# Patient Record
Sex: Female | Born: 1970 | Race: White | Hispanic: No | Marital: Married | Smoking: Former smoker
Health system: Southern US, Community
[De-identification: ages and names within clinical notes are randomized; demographics above are authoritative.]

## PROBLEM LIST (undated history)

## (undated) DIAGNOSIS — E785 Hyperlipidemia, unspecified: Secondary | ICD-10-CM

## (undated) DIAGNOSIS — I10 Essential (primary) hypertension: Secondary | ICD-10-CM

## (undated) DIAGNOSIS — R7611 Nonspecific reaction to tuberculin skin test without active tuberculosis: Secondary | ICD-10-CM

## (undated) DIAGNOSIS — Z91018 Allergy to other foods: Secondary | ICD-10-CM

## (undated) DIAGNOSIS — T7840XA Allergy, unspecified, initial encounter: Secondary | ICD-10-CM

## (undated) DIAGNOSIS — E079 Disorder of thyroid, unspecified: Secondary | ICD-10-CM

## (undated) DIAGNOSIS — F410 Panic disorder [episodic paroxysmal anxiety] without agoraphobia: Secondary | ICD-10-CM

## (undated) HISTORY — DX: Disorder of thyroid, unspecified: E07.9

## (undated) HISTORY — DX: Allergy to other foods: Z91.018

## (undated) HISTORY — PX: TONSILLECTOMY: SHX5217

## (undated) HISTORY — DX: Essential (primary) hypertension: I10

## (undated) HISTORY — PX: OTHER SURGICAL HISTORY: SHX169

## (undated) HISTORY — PX: FOOT SURGERY: SHX648

## (undated) HISTORY — DX: Panic disorder (episodic paroxysmal anxiety): F41.0

## (undated) HISTORY — DX: Allergy, unspecified, initial encounter: T78.40XA

## (undated) HISTORY — DX: Hyperlipidemia, unspecified: E78.5

## (undated) HISTORY — DX: Nonspecific reaction to tuberculin skin test without active tuberculosis: R76.11

## (undated) HISTORY — PX: WRIST SURGERY: SHX841

## (undated) HISTORY — PX: BREAST REDUCTION SURGERY: SHX8

---

## 1998-12-15 ENCOUNTER — Other Ambulatory Visit: Admission: RE | Admit: 1998-12-15 | Discharge: 1998-12-15 | Payer: Self-pay | Admitting: *Deleted

## 1999-02-25 ENCOUNTER — Other Ambulatory Visit: Admission: RE | Admit: 1999-02-25 | Discharge: 1999-02-25 | Payer: Self-pay | Admitting: Obstetrics and Gynecology

## 2000-02-09 HISTORY — PX: REDUCTION MAMMAPLASTY: SUR839

## 2000-08-25 ENCOUNTER — Encounter (INDEPENDENT_AMBULATORY_CARE_PROVIDER_SITE_OTHER): Payer: Self-pay | Admitting: *Deleted

## 2000-08-25 ENCOUNTER — Other Ambulatory Visit: Admission: RE | Admit: 2000-08-25 | Discharge: 2000-08-25 | Payer: Self-pay | Admitting: Plastic Surgery

## 2001-05-11 ENCOUNTER — Ambulatory Visit (HOSPITAL_COMMUNITY): Admission: AD | Admit: 2001-05-11 | Discharge: 2001-05-11 | Payer: Self-pay | Admitting: Internal Medicine

## 2001-05-11 ENCOUNTER — Encounter: Payer: Self-pay | Admitting: Internal Medicine

## 2002-08-20 ENCOUNTER — Other Ambulatory Visit: Admission: RE | Admit: 2002-08-20 | Discharge: 2002-08-20 | Payer: Self-pay | Admitting: Internal Medicine

## 2003-02-09 HISTORY — PX: BREAST BIOPSY: SHX20

## 2004-02-09 HISTORY — PX: DILATION AND CURETTAGE OF UTERUS: SHX78

## 2004-12-07 ENCOUNTER — Inpatient Hospital Stay (HOSPITAL_COMMUNITY): Admission: AD | Admit: 2004-12-07 | Discharge: 2004-12-07 | Payer: Self-pay | Admitting: Gynecology

## 2004-12-11 ENCOUNTER — Encounter (INDEPENDENT_AMBULATORY_CARE_PROVIDER_SITE_OTHER): Payer: Self-pay | Admitting: Specialist

## 2004-12-11 ENCOUNTER — Ambulatory Visit (HOSPITAL_COMMUNITY): Admission: RE | Admit: 2004-12-11 | Discharge: 2004-12-11 | Payer: Self-pay | Admitting: Gynecology

## 2005-06-10 ENCOUNTER — Ambulatory Visit (HOSPITAL_COMMUNITY): Admission: RE | Admit: 2005-06-10 | Discharge: 2005-06-10 | Payer: Self-pay | Admitting: Internal Medicine

## 2006-08-22 ENCOUNTER — Ambulatory Visit (HOSPITAL_COMMUNITY): Admission: RE | Admit: 2006-08-22 | Discharge: 2006-08-22 | Payer: Self-pay | Admitting: Internal Medicine

## 2007-06-05 ENCOUNTER — Ambulatory Visit: Payer: Self-pay | Admitting: Obstetrics and Gynecology

## 2007-06-05 ENCOUNTER — Other Ambulatory Visit: Payer: Self-pay

## 2007-08-25 ENCOUNTER — Ambulatory Visit: Payer: Self-pay | Admitting: Obstetrics and Gynecology

## 2007-09-07 ENCOUNTER — Ambulatory Visit: Payer: Self-pay | Admitting: Obstetrics and Gynecology

## 2007-09-08 ENCOUNTER — Inpatient Hospital Stay: Payer: Self-pay | Admitting: Obstetrics and Gynecology

## 2009-06-27 ENCOUNTER — Encounter: Payer: Self-pay | Admitting: Cardiovascular Disease

## 2009-07-11 ENCOUNTER — Ambulatory Visit: Payer: Self-pay | Admitting: Cardiovascular Disease

## 2009-07-11 DIAGNOSIS — R9431 Abnormal electrocardiogram [ECG] [EKG]: Secondary | ICD-10-CM | POA: Insufficient documentation

## 2009-07-11 DIAGNOSIS — R002 Palpitations: Secondary | ICD-10-CM

## 2009-07-11 DIAGNOSIS — E785 Hyperlipidemia, unspecified: Secondary | ICD-10-CM | POA: Insufficient documentation

## 2009-07-14 ENCOUNTER — Encounter: Payer: Self-pay | Admitting: Cardiovascular Disease

## 2009-11-20 ENCOUNTER — Encounter: Payer: Self-pay | Admitting: Obstetrics and Gynecology

## 2010-03-10 NOTE — Assessment & Plan Note (Signed)
Summary: NP6/CHANGES IN EKG/FLUTTER IN CHEST   Visit Type:  Initial Consult Primary Provider:  Marisue Brooklyn  CC:  palpitations.  History of Present Illness: Ms. Diane Hahn is a very pleasant 40 year old woman with a history of anxiety/panic attacks recently seen by her physician, Marisue Brooklyn for symptoms of fluttering in her chest and abnormal EKG. She presents for evaluation.  Ms. Yearwood states that she has been undergoing carpal stress. She is a case Production designer, theatre/television/film and believes that her job is in jeopardy who state that she cuts. This has caused tremendous stress to her and her family. She has a busy work schedule working from early in the morning to late into the evening, takes care of her daughter. She walks 3 times per week with no significant symptoms or shortness of breath or chest pain.  She murmurs one episode when he state and advanced possible budget cuts that she was very stressed and had fluttering in her chest. It lasted for several seconds before it went away. She has not had any further episodes since that time.  EKG done in Dr. Carmela Hurt in its office shows sinus arrhythmia with heart rate of 65 beats per minute, poor R-wave progression through the precordial leads.  Preventive Screening-Counseling & Management  Alcohol-Tobacco     Smoking Status: quit      Drug Use:  no.    Current Medications (verified): 1)  Vitamin B-1 250 Mg  Tabs (Thiamine Hcl) .... Once Daily 2)  Vitamin D3 5000 Iu .... Once Daily 3)  Vitamin B Complex-C   Caps (B Complex-C) .... Once Daily 4)  Fish Oil   Oil (Fish Oil) .... Two Tablets Two Times A Day 5)  Lexapro 10 Mg Tabs (Escitalopram Oxalate) .... One Half Tablet Everyday 6)  Clarinex 5 Mg Tabs (Desloratadine) .... As Needed  Allergies (verified): 1)  ! Pcn 2)  ! Keflex  Past History:  Social History: Last updated: 07/11/2009 Full Time--Social Worker Married  Tobacco Use - Former. 2008 Alcohol Use - yes--1-2 glasses of wine with  dinner Drug Use - no  Risk Factors: Smoking Status: quit (07/11/2009)  Past Medical History: panic attacks hyperlipidemia  Past Surgical History: ovay and cyst removed tonsillectomy Breast reduction Foot surg. C-section  Family History: Valvular disease Family History of Coronary Artery Disease:  Family History of Sudden Cardiac Death:  Family History of Diabetes:  Family History of Hyperlipidemia:  Family History of Hypertension:  Family History of Cancer:  Family History of Thyroid Disease:   Social History: Full Time--Social Worker Married  Tobacco Use - Former. 2008 Alcohol Use - yes--1-2 glasses of wine with dinner Drug Use - no Smoking Status:  quit Drug Use:  no  Review of Systems  The patient denies fever, weight loss, weight gain, vision loss, decreased hearing, hoarseness, chest pain, syncope, dyspnea on exertion, peripheral edema, prolonged cough, abdominal pain, incontinence, muscle weakness, depression, and enlarged lymph nodes.         fluttering, resolved  Vital Signs:  Patient profile:   40 year old female Height:      65 inches Weight:      184 pounds BMI:     30.73 Pulse rate:   66 / minute BP sitting:   143 / 84  (left arm) Cuff size:   regular  Vitals Entered By: Bishop Dublin, CMA (July 11, 2009 3:47 PM)  Physical Exam  General:  Well developed, well nourished, in no acute distress. Head:  normocephalic and  atraumatic Neck:  Neck supple, no JVD. No masses, thyromegaly or abnormal cervical nodes. Chest Wall:  no deformities or breast masses noted Lungs:  Clear bilaterally to auscultation and percussion. Heart:  Non-displaced PMI, chest non-tender; regular rate and rhythm, S1, S2 without murmurs, rubs or gallops. Carotid upstroke normal, no bruit. . Pedals normal pulses. No edema, no varicosities. Abdomen:  Bowel sounds positive; abdomen soft and non-tender without masses, organomegaly, or hernias noted. No hepatosplenomegaly. Msk:   Back normal, normal gait. Muscle strength and tone normal. Pulses:  pulses normal in all 4 extremities Extremities:  No clubbing or cyanosis. Neurologic:  Alert and oriented x 3. Skin:  Intact without lesions or rashes. Psych:  Normal affect.    EKG  Procedure date:  07/11/2009  Findings:      normal sinus rhythm with sinus arrhythmia him a rate of 66 beats per minute, no other significant ST or T wave changes noted.  Impression & Recommendations:  Problem # 1:  ABNORMAL EKG (ICD-794.31) EKG shows sinus arrhythmia which is generally a benign rhythm. She is asymptomatic. He R wave progression is also not a particular concern. Sometimes this can be due to lead placement. The R-wave progression on our EKG today is essentially normal.  No further testing is needed. She is asymptomatic with exertion and no stress testing is needed.  Problem # 2:  PALPITATIONS (ICD-785.1) We did talk to her about ectopy and palpitations. The short period of fluttering that she had may have been due to ectopy or palpitations. If she continues to have frequent episodes, a heart monitor could be performed. The treatment would be low-dose beta blockers either on an as needed basis or on a daily basis.  as she has had no further episodes of fluttering, no further workup is needed.  Problem # 3:  HYPERLIPIDEMIA-MIXED (ICD-272.4) we spent some time talking about hyperlipidemia. She does have a strong family history of coronary artery disease. I suggested that she try aggressive diet and exercise with a recheck of her cholesterol in 3 months time. Ideally her cholesterol should be well below 200. If she cannot reach this goal, she may want to restart her low-dose generic cholesterol medication.  Patient Instructions: 1)  We will see you back as needed.

## 2010-03-10 NOTE — Letter (Signed)
Summary: HEALTH HX  HEALTH HX   Imported ByFrazier Butt Chriscoe 07/14/2009 10:24:08  _____________________________________________________________________  External Attachment:    Type:   Image     Comment:   External Document

## 2010-05-22 ENCOUNTER — Ambulatory Visit: Payer: Self-pay | Admitting: Obstetrics and Gynecology

## 2010-05-25 ENCOUNTER — Inpatient Hospital Stay: Payer: Self-pay | Admitting: Obstetrics and Gynecology

## 2010-05-25 HISTORY — PX: TUBAL LIGATION: SHX77

## 2010-06-16 ENCOUNTER — Ambulatory Visit: Payer: Self-pay | Admitting: Obstetrics and Gynecology

## 2010-06-26 NOTE — Op Note (Signed)
[place]                 ACCOUNT NO.:  [id number]   MEDICAL RECORD NO.:  [id number]          PATIENT TYPE:  AMB   LOCATION:  [facility]                           FACILITY:  [facility]   PHYSICIAN:  Ginger Carne, MD  DATE OF BIRTH:  31-Mar-1970   DATE OF PROCEDURE:  12/11/2004  DATE OF DISCHARGE:                                 OPERATIVE REPORT   PREOPERATIVE DIAGNOSES:  First trimester incomplete abortion.   POSTOPERATIVE DIAGNOSES:  First trimester incomplete abortion.   PROCEDURE:  Aspiration, dilatation and extraction.   SURGEON:  Ginger Carne, MD.   ASSISTANT:  None.   COMPLICATIONS:  None immediate.   ESTIMATED BLOOD LOSS:  Minimal.   ANESTHESIA:  MAC with Marcaine 0.5% with epinephrine 1:200,000 paracervical  block.   SPECIMENS:  Products of conception.   FINDINGS:  External genitalia, vulva and vagina normal, cervix smooth  without erosions or lesions. The uterus sounded to 8 cm, products of  conception noted. The cavity was smooth. Both adnexa palpable, found to be  normal.   DESCRIPTION OF PROCEDURE:  The patient prepped and draped in the usual  fashion and placed in the lithotomy position. Betadine solution used for  antiseptic after adequate MAC analgesia and a paracervical block obtained.  Tenaculum placed on the anterior lip of the cervix, dilatation to  accommodate a #7 suction curette was performed, specimen to pathology, no  bleeding noted at the end of the procedure. The patient returned to the post  anesthesia recovery room in excellent condition.      Ginger Carne, MD  Electronically Signed     SHB/MEDQ  D:  12/11/2004  T:  12/11/2004  Job:  [id number]

## 2010-07-09 ENCOUNTER — Emergency Department: Payer: Self-pay | Admitting: Emergency Medicine

## 2010-08-21 ENCOUNTER — Encounter: Payer: Self-pay | Admitting: Cardiovascular Disease

## 2010-11-16 ENCOUNTER — Other Ambulatory Visit (HOSPITAL_COMMUNITY): Payer: Self-pay | Admitting: Internal Medicine

## 2010-11-16 ENCOUNTER — Ambulatory Visit (HOSPITAL_COMMUNITY)
Admission: RE | Admit: 2010-11-16 | Discharge: 2010-11-16 | Disposition: A | Discharge status: Home / Self Care | Payer: BC Managed Care – PPO | Source: Ambulatory Visit | Attending: Internal Medicine | Admitting: Internal Medicine

## 2010-11-16 DIAGNOSIS — Z0000 Encounter for general adult medical examination without abnormal findings: Secondary | ICD-10-CM | POA: Insufficient documentation

## 2010-11-16 DIAGNOSIS — R059 Cough, unspecified: Secondary | ICD-10-CM | POA: Insufficient documentation

## 2010-11-16 DIAGNOSIS — R05 Cough: Secondary | ICD-10-CM | POA: Insufficient documentation

## 2010-11-16 DIAGNOSIS — R7611 Nonspecific reaction to tuberculin skin test without active tuberculosis: Secondary | ICD-10-CM | POA: Insufficient documentation

## 2011-02-04 ENCOUNTER — Ambulatory Visit (INDEPENDENT_AMBULATORY_CARE_PROVIDER_SITE_OTHER): Payer: BC Managed Care – PPO | Admitting: Internal Medicine

## 2011-02-04 ENCOUNTER — Encounter: Payer: Self-pay | Admitting: Internal Medicine

## 2011-02-04 VITALS — BP 110/80 | HR 81 | Temp 98.2°F | Wt 201.0 lb

## 2011-02-04 DIAGNOSIS — E785 Hyperlipidemia, unspecified: Secondary | ICD-10-CM | POA: Insufficient documentation

## 2011-02-04 DIAGNOSIS — K1370 Unspecified lesions of oral mucosa: Secondary | ICD-10-CM

## 2011-02-04 DIAGNOSIS — J40 Bronchitis, not specified as acute or chronic: Secondary | ICD-10-CM

## 2011-02-04 DIAGNOSIS — K121 Other forms of stomatitis: Secondary | ICD-10-CM | POA: Insufficient documentation

## 2011-02-04 DIAGNOSIS — D649 Anemia, unspecified: Secondary | ICD-10-CM | POA: Insufficient documentation

## 2011-02-04 MED ORDER — AZITHROMYCIN 250 MG PO TABS: ORAL_TABLET | ORAL | Status: AC

## 2011-02-04 MED ORDER — PREDNISONE (PAK) 10 MG PO TABS: ORAL_TABLET | ORAL | Status: AC

## 2011-02-04 NOTE — Patient Instructions (Signed)
Labs next Wednesday.  Start prednisone taper and antibiotics.  Call if symptoms aren't improving.  Follow up 2 weeks.

## 2011-02-04 NOTE — Progress Notes (Signed)
Subjective:    Patient ID: Diane Hahn, female    DOB: 11/25/1970, 40 y.o.   MRN: [id number]  HPI 40 year old female presents to establish care. She has 2 concerns today. First, she notes a three-week history of nasal congestion, cough, and shortness of breath. She notes that she cares for her 2 children, a 53-month-old infant and a 4-year-old. She notes that they're frequently ill with viral infections. She was initially sick in October and was treated with a Z-Pak with slight improvement in her symptoms. However, over the last 3 weeks she has had recurrent shortness of breath, and cough productive of purulent sputum. She can hear herself wheezing on occasion. She is a former smoker. She notes that she had a chronic cough as a child but was never diagnosed with asthma. She has not had any fever or chills. She has not taken any medication for this, except for occasional use of albuterol with minimal improvement. She denies any chest pain.  She is also concerned today about several month history of recurrent oral ulcers. She notes that he first developed after the birth of her infant 9 months ago. They're most prominent on the right side of her mouth. She sought treatment from her dentist and has been using a topical protective ointment with some improvement.  Outpatient Encounter Prescriptions as of 02/04/2011  Medication Sig Dispense Refill  . desloratadine (CLARINEX) 5 MG tablet Take 5 mg by mouth as needed.        . B Complex-C (B-COMPLEX WITH VITAMIN C) tablet Take 1 tablet by mouth daily.          Review of Systems  Constitutional: Negative for fever, chills, appetite change, fatigue and unexpected weight change.  HENT: Positive for congestion and mouth sores. Negative for ear pain, sore throat, trouble swallowing, neck pain, voice change and sinus pressure.   Eyes: Negative for visual disturbance.  Respiratory: Positive for cough, shortness of breath and wheezing. Negative for stridor.     Cardiovascular: Negative for chest pain, palpitations and leg swelling.  Gastrointestinal: Negative for nausea, vomiting, abdominal pain, diarrhea, constipation, blood in stool, abdominal distention and anal bleeding.  Genitourinary: Negative for dysuria and flank pain.  Musculoskeletal: Positive for arthralgias (right knee). Negative for myalgias and gait problem.  Skin: Negative for color change and rash.  Neurological: Negative for dizziness and headaches.  Hematological: Negative for adenopathy. Does not bruise/bleed easily.  Psychiatric/Behavioral: Negative for suicidal ideas, sleep disturbance and dysphoric mood. The patient is not nervous/anxious.    BP 110/80  Pulse 81  Temp(Src) 98.2 F (36.8 C) (Oral)  Wt 201 lb (91.173 kg)  SpO2 97%  LMP 01/28/2011     Objective:   Physical Exam  Constitutional: She is oriented to person, place, and time. She appears well-developed and well-nourished. No distress.  HENT:  Head: Normocephalic and atraumatic.  Right Ear: External ear normal.  Left Ear: External ear normal.  Nose: Nose normal.  Mouth/Throat: Oropharynx is clear and moist. Oral lesions (right buccal mucosa) present. No oropharyngeal exudate.  Eyes: Conjunctivae are normal. Pupils are equal, round, and reactive to light. Right eye exhibits no discharge. Left eye exhibits no discharge. No scleral icterus.  Neck: Normal range of motion. Neck supple. No tracheal deviation present. No thyromegaly present.  Cardiovascular: Normal rate, regular rhythm, normal heart sounds and intact distal pulses.  Exam reveals no gallop and no friction rub.   No murmur heard. Pulmonary/Chest: Effort normal. No accessory muscle usage. Not tachypneic. No  respiratory distress. She has decreased breath sounds (very poor air movement, prolonged expiratory phase). She has no wheezes. She has no rales. She exhibits no tenderness.  Musculoskeletal: Normal range of motion. She exhibits no edema and no  tenderness.  Lymphadenopathy:    She has no cervical adenopathy.  Neurological: She is alert and oriented to person, place, and time. No cranial nerve deficit. She exhibits normal muscle tone. Coordination normal.  Skin: Skin is warm and dry. No rash noted. She is not diaphoretic. No erythema. No pallor.  Psychiatric: She has a normal mood and affect. Her behavior is normal. Judgment and thought content normal.          Assessment & Plan:  1. Bronchitis - symptoms and exam are most consistent with bronchitis. Will start Z-Pak and prednisone taper. She will continue to use albuterol as needed for wheezing or cough. She will followup in 2 weeks. If no improvement, would favor getting a chest x-ray.  2. Oral ulcers -patient with recurrent oral ulcers. She does have a history of smoking. Given the persistence of these ulcers, question if biopsy might be helpful in determining the etiology. We will set her up with ENT for evaluation.

## 2011-02-10 ENCOUNTER — Other Ambulatory Visit (INDEPENDENT_AMBULATORY_CARE_PROVIDER_SITE_OTHER): Payer: BC Managed Care – PPO | Admitting: *Deleted

## 2011-02-10 DIAGNOSIS — E785 Hyperlipidemia, unspecified: Secondary | ICD-10-CM

## 2011-02-10 DIAGNOSIS — D649 Anemia, unspecified: Secondary | ICD-10-CM

## 2011-02-10 LAB — COMPREHENSIVE METABOLIC PANEL
BUN: 15 mg/dL (ref 6–23)
CO2: 28 mEq/L (ref 19–32)
GFR: 72.97 mL/min (ref >60.00)
Glucose, Bld: 93 mg/dL (ref 70–99)
Sodium: 140 mEq/L (ref 135–145)
Total Bilirubin: 0.8 mg/dL (ref 0.3–1.2)
Total Protein: 7.4 g/dL (ref 6.0–8.3)

## 2011-02-10 LAB — CBC WITH DIFFERENTIAL/PLATELET
Basophils Absolute: 0 10*3/uL (ref 0.0–0.1)
Eosinophils Absolute: 0.1 10*3/uL (ref 0.0–0.7)
HCT: 39.4 % (ref 36.0–46.0)
Hemoglobin: 13.5 g/dL (ref 12.0–15.0)
Lymphocytes Relative: 41.8 % (ref 12.0–46.0)
Lymphs Abs: 3.6 10*3/uL (ref 0.7–4.0)
MCHC: 34.2 g/dL (ref 30.0–36.0)
Neutro Abs: 3.9 10*3/uL (ref 1.4–7.7)
RDW: 14.2 % (ref 11.5–14.6)

## 2011-02-10 LAB — LIPID PANEL
Cholesterol: 229 mg/dL — ABNORMAL HIGH (ref 0–200)
VLDL: 33.2 mg/dL (ref 0.0–40.0)

## 2011-02-20 ENCOUNTER — Encounter: Payer: Self-pay | Admitting: Internal Medicine

## 2011-02-24 ENCOUNTER — Encounter: Payer: Self-pay | Admitting: Internal Medicine

## 2011-02-24 ENCOUNTER — Ambulatory Visit (INDEPENDENT_AMBULATORY_CARE_PROVIDER_SITE_OTHER): Payer: BC Managed Care – PPO | Admitting: Internal Medicine

## 2011-02-24 VITALS — BP 112/70 | HR 55 | Temp 97.6°F | Ht 66.0 in | Wt 199.0 lb

## 2011-02-24 DIAGNOSIS — J40 Bronchitis, not specified as acute or chronic: Secondary | ICD-10-CM

## 2011-02-24 DIAGNOSIS — K121 Other forms of stomatitis: Secondary | ICD-10-CM

## 2011-02-24 DIAGNOSIS — E785 Hyperlipidemia, unspecified: Secondary | ICD-10-CM

## 2011-02-24 DIAGNOSIS — K1370 Unspecified lesions of oral mucosa: Secondary | ICD-10-CM

## 2011-02-24 MED ORDER — ROSUVASTATIN CALCIUM 5 MG PO TABS: 5.0000 mg | ORAL_TABLET | Freq: Every day | ORAL | Status: DC

## 2011-02-24 NOTE — Progress Notes (Signed)
Subjective:    Patient ID: Diane Hahn, female    DOB: 1970-05-18, 41 y.o.   MRN: [id number]  HPI 41 year old female with a history of hyperlipidemia presents for followup. She was recently seen in clinic for an acute episode of bronchitis. She was treated with antibiotics and steroids. She reports complete resolution of her symptoms of cough and shortness of breath with the use of these medications. She denies any recurrence of symptoms.  In regards to her hyperlipidemia, recent cholesterol tests show LDL is elevated at 150. She notes a very strong family history of heart disease including in her father who died at age 19 after extensive heart disease. She also notes a cousin who died at age 35 from heart attack. She is interested in starting cholesterol medication. She notes that she has tried making significant changes to her diet without improvement in her cholesterol levels. She also notes that she tried Lipitor in the past and was not able to tolerate this medication. She is not sure what her specific reaction to the medication was.  In regards to her oral ulcers, she notes that her ulcers resolved completely after treatment with antibiotics and steroids for her bronchitis. They have not recurred. She has not yet seen an ENT physician for this.  Outpatient Encounter Prescriptions as of 02/24/2011  Medication Sig Dispense Refill  . B Complex-C (B-COMPLEX WITH VITAMIN C) tablet Take 1 tablet by mouth daily.        . Cholecalciferol (VITAMIN D-3) 5000 UNITS TABS Take 1 tablet by mouth daily.        Marland Kitchen desloratadine (CLARINEX) 5 MG tablet Take 5 mg by mouth as needed.        Marland Kitchen escitalopram (LEXAPRO) 10 MG tablet Take 20 mg by mouth daily.       . fish oil-omega-3 fatty acids 1000 MG capsule Take 4 g by mouth 2 (two) times daily.        Marland Kitchen PRESCRIPTION MEDICATION IRON supplement once daily       . Red Yeast Rice 600 MG CAPS Take by mouth daily.      . Thiamine HCl (VITAMIN B-1) 250 MG tablet Take  250 mg by mouth daily.        . rosuvastatin (CRESTOR) 5 MG tablet Take 1 tablet (5 mg total) by mouth daily.  30 tablet  3    Review of Systems  Constitutional: Negative for fever, chills, appetite change, fatigue and unexpected weight change.  HENT: Negative for ear pain, congestion, sore throat, trouble swallowing, neck pain, voice change and sinus pressure.   Eyes: Negative for visual disturbance.  Respiratory: Negative for cough, shortness of breath, wheezing and stridor.   Cardiovascular: Negative for chest pain, palpitations and leg swelling.  Gastrointestinal: Negative for nausea, vomiting, abdominal pain, diarrhea, constipation, blood in stool, abdominal distention and anal bleeding.  Genitourinary: Negative for dysuria and flank pain.  Musculoskeletal: Negative for myalgias, arthralgias and gait problem.  Skin: Negative for color change and rash.  Neurological: Negative for dizziness and headaches.  Hematological: Negative for adenopathy. Does not bruise/bleed easily.  Psychiatric/Behavioral: Negative for suicidal ideas, sleep disturbance and dysphoric mood. The patient is not nervous/anxious.        Objective:   Physical Exam  Constitutional: She is oriented to person, place, and time. She appears well-developed and well-nourished. No distress.  HENT:  Head: Normocephalic and atraumatic.  Right Ear: External ear normal.  Left Ear: External ear normal.  Nose: Nose normal.  Mouth/Throat: Oropharynx is clear and moist. No oropharyngeal exudate.  Eyes: Conjunctivae are normal. Pupils are equal, round, and reactive to light. Right eye exhibits no discharge. Left eye exhibits no discharge. No scleral icterus.  Neck: Normal range of motion. Neck supple. No tracheal deviation present. No thyromegaly present.  Cardiovascular: Normal rate, regular rhythm, normal heart sounds and intact distal pulses.  Exam reveals no gallop and no friction rub.   No murmur heard. Pulmonary/Chest:  Effort normal and breath sounds normal. No respiratory distress. She has no wheezes. She has no rales. She exhibits no tenderness.  Musculoskeletal: Normal range of motion. She exhibits no edema and no tenderness.  Lymphadenopathy:    She has no cervical adenopathy.  Neurological: She is alert and oriented to person, place, and time. No cranial nerve deficit. She exhibits normal muscle tone. Coordination normal.  Skin: Skin is warm and dry. No rash noted. She is not diaphoretic. No erythema. No pallor.  Psychiatric: She has a normal mood and affect. Her behavior is normal. Judgment and thought content normal.          Assessment & Plan:  1. Hyperlipidemia - LDL elevated at 150. Strong family h/o heart disease. Goal LDL<70. Will start Crestor 5mg  daily. (pt had adverse reaction to lipitor in the past).  Will recheck lipids and LFTs in 1 month.  2. Bronchitis - Symptoms have completely resolved with treatment with antibiotics and prednisone. Will continue to monitor.  3. Oral ulcers - Resolved. Will continue to monitor.

## 2011-03-29 ENCOUNTER — Other Ambulatory Visit (INDEPENDENT_AMBULATORY_CARE_PROVIDER_SITE_OTHER): Payer: BC Managed Care – PPO

## 2011-03-29 DIAGNOSIS — E785 Hyperlipidemia, unspecified: Secondary | ICD-10-CM

## 2011-03-29 LAB — LDL CHOLESTEROL, DIRECT: Direct LDL: 100.7 mg/dL

## 2011-03-29 LAB — COMPREHENSIVE METABOLIC PANEL
AST: 22 U/L (ref 0–37)
Alkaline Phosphatase: 73 U/L (ref 39–117)
BUN: 12 mg/dL (ref 6–23)
Creatinine, Ser: 0.9 mg/dL (ref 0.4–1.2)
Total Bilirubin: 0.3 mg/dL (ref 0.3–1.2)

## 2011-03-29 LAB — LIPID PANEL
HDL: 40.9 mg/dL (ref >39.00)
Total CHOL/HDL Ratio: 4
Triglycerides: 211 mg/dL — ABNORMAL HIGH (ref 0.0–149.0)

## 2011-10-20 ENCOUNTER — Other Ambulatory Visit: Payer: Self-pay | Admitting: Internal Medicine

## 2011-10-29 ENCOUNTER — Ambulatory Visit (INDEPENDENT_AMBULATORY_CARE_PROVIDER_SITE_OTHER): Payer: BC Managed Care – PPO | Admitting: Internal Medicine

## 2011-10-29 ENCOUNTER — Encounter: Payer: Self-pay | Admitting: Internal Medicine

## 2011-10-29 VITALS — BP 102/78 | HR 67 | Temp 98.3°F | Ht 66.0 in | Wt 194.0 lb

## 2011-10-29 DIAGNOSIS — E669 Obesity, unspecified: Secondary | ICD-10-CM | POA: Insufficient documentation

## 2011-10-29 DIAGNOSIS — Z1211 Encounter for screening for malignant neoplasm of colon: Secondary | ICD-10-CM

## 2011-10-29 DIAGNOSIS — F419 Anxiety disorder, unspecified: Secondary | ICD-10-CM | POA: Insufficient documentation

## 2011-10-29 DIAGNOSIS — E785 Hyperlipidemia, unspecified: Secondary | ICD-10-CM

## 2011-10-29 DIAGNOSIS — Z23 Encounter for immunization: Secondary | ICD-10-CM

## 2011-10-29 DIAGNOSIS — F411 Generalized anxiety disorder: Secondary | ICD-10-CM

## 2011-10-29 LAB — COMPREHENSIVE METABOLIC PANEL
ALT: 36 U/L — ABNORMAL HIGH (ref 0–35)
AST: 58 U/L — ABNORMAL HIGH (ref 0–37)
Creatinine, Ser: 0.8 mg/dL (ref 0.4–1.2)
GFR: 83.15 mL/min (ref >60.00)
Total Bilirubin: 0.6 mg/dL (ref 0.3–1.2)

## 2011-10-29 LAB — LDL CHOLESTEROL, DIRECT: Direct LDL: 134 mg/dL

## 2011-10-29 LAB — LIPID PANEL
HDL: 34.9 mg/dL — ABNORMAL LOW (ref >39.00)
Triglycerides: 182 mg/dL — ABNORMAL HIGH (ref 0.0–149.0)
VLDL: 36.4 mg/dL (ref 0.0–40.0)

## 2011-10-29 MED ORDER — ALPRAZOLAM 0.5 MG PO TABS: 0.5000 mg | ORAL_TABLET | Freq: Three times a day (TID) | ORAL | Status: DC | PRN

## 2011-10-29 MED ORDER — PHENTERMINE HCL 37.5 MG PO CAPS: 37.5000 mg | ORAL_CAPSULE | ORAL | Status: DC

## 2011-10-29 MED ORDER — ALPRAZOLAM 0.5 MG PO TBDP: 0.5000 mg | ORAL_TABLET | Freq: Two times a day (BID) | ORAL | Status: DC | PRN

## 2011-10-29 MED ORDER — ROSUVASTATIN CALCIUM 5 MG PO TABS: 5.0000 mg | ORAL_TABLET | Freq: Every day | ORAL | Status: DC

## 2011-10-29 NOTE — Assessment & Plan Note (Signed)
Will recheck lipids and LFTs with labs today. Continue Crestor. Encouraged efforts at healthy diet, low in saturated fat and high in fiber. And regular physical activity. Followup one month.

## 2011-10-29 NOTE — Assessment & Plan Note (Signed)
BMI 31. Patient is interested in trying supplement medications to help with weight loss. Cautioned her on using supplements given unknown side effects. We discussed prescription medication to help with appetite suppression. Will start phentermine daily. Discussed potential side effects of this medication. Patient will call if any questions or concerns. Goal weight loss 1 pound per week. Encouraged patient to keep a food diary and limit caloric intake. Encouraged her to set a goal of exercise 5 hours per week. Followup one month.

## 2011-10-29 NOTE — Progress Notes (Signed)
Subjective:    Patient ID: Diane Hahn, female    DOB: 07/25/1970, 41 y.o.   MRN: [id number]  HPI 41 year old female with history of anxiety, hyperlipidemia, and obesity presents for followup. In regards to hyperlipidemia, she reports full compliance of Crestor. She denies any side effects from this medication. Last LDL performed 6 months ago showed marked improvement in LDL with reduction to 100.  In regards to her anxiety, she reports worsening of symptoms immediately prior to her menstrual cycle. She has been using Xanax 2-3 days before the onset of her menses with some improvement in her symptoms. She has also been using Lexapro daily with some improvement. Anxiety is significant enough that it affects her ability to work and take care of her children.  In regards to her obesity, she is concerned about recent weight gain. She reports that she has heard that supplemental medications such as green tea extract might be helpful for losing weight. She reports that she does not have time to exercise because of work responsibilities in caring for young children. She does not follow a specific diet or keep a food diary.  Outpatient Encounter Prescriptions as of 10/29/2011  Medication Sig Dispense Refill  . desloratadine (CLARINEX) 5 MG tablet Take 5 mg by mouth as needed.        Marland Kitchen escitalopram (LEXAPRO) 20 MG tablet TAKE 1 TABLET BY MOUTH EVERY DAY  30 tablet  4  . ALPRAZolam (XANAX) 0.5 MG tablet Take 1 tablet (0.5 mg total) by mouth 3 (three) times daily as needed for sleep.  60 tablet  3  . Cholecalciferol (VITAMIN D-3) 5000 UNITS TABS Take 1 tablet by mouth daily.        . phentermine 37.5 MG capsule Take 1 capsule (37.5 mg total) by mouth every morning.  30 capsule  1  . rosuvastatin (CRESTOR) 5 MG tablet Take 1 tablet (5 mg total) by mouth daily.  30 tablet  3  . Thiamine HCl (VITAMIN B-1) 250 MG tablet Take 250 mg by mouth daily.         BP 102/78  Pulse 67  Temp 98.3 F (36.8 C) (Oral)   Ht 5\' 6"  (1.676 m)  Wt 194 lb (87.998 kg)  BMI 31.31 kg/m2  SpO2 97%  Review of Systems  Constitutional: Negative for fever, chills, appetite change, fatigue and unexpected weight change.  HENT: Negative for ear pain, congestion, sore throat, trouble swallowing, neck pain, voice change and sinus pressure.   Eyes: Negative for visual disturbance.  Respiratory: Negative for cough, shortness of breath, wheezing and stridor.   Cardiovascular: Negative for chest pain, palpitations and leg swelling.  Gastrointestinal: Negative for nausea, vomiting, abdominal pain, diarrhea, constipation, blood in stool, abdominal distention and anal bleeding.  Genitourinary: Negative for dysuria and flank pain.  Musculoskeletal: Negative for myalgias, arthralgias and gait problem.  Skin: Negative for color change and rash.  Neurological: Negative for dizziness and headaches.  Hematological: Negative for adenopathy. Does not bruise/bleed easily.  Psychiatric/Behavioral: Positive for dysphoric mood. Negative for suicidal ideas and disturbed wake/sleep cycle. The patient is nervous/anxious.        Objective:   Physical Exam  Constitutional: She is oriented to person, place, and time. She appears well-developed and well-nourished. No distress.  HENT:  Head: Normocephalic and atraumatic.  Right Ear: External ear normal.  Left Ear: External ear normal.  Nose: Nose normal.  Mouth/Throat: Oropharynx is clear and moist. No oropharyngeal exudate.  Eyes: Conjunctivae normal are normal. Pupils  are equal, round, and reactive to light. Right eye exhibits no discharge. Left eye exhibits no discharge. No scleral icterus.  Neck: Normal range of motion. Neck supple. No tracheal deviation present. No thyromegaly present.  Cardiovascular: Normal rate, regular rhythm, normal heart sounds and intact distal pulses.  Exam reveals no gallop and no friction rub.   No murmur heard. Pulmonary/Chest: Effort normal and breath sounds  normal. No respiratory distress. She has no wheezes. She has no rales. She exhibits no tenderness.  Musculoskeletal: Normal range of motion. She exhibits no edema and no tenderness.  Lymphadenopathy:    She has no cervical adenopathy.  Neurological: She is alert and oriented to person, place, and time. No cranial nerve deficit. She exhibits normal muscle tone. Coordination normal.  Skin: Skin is warm and dry. No rash noted. She is not diaphoretic. No erythema. No pallor.  Psychiatric: She has a normal mood and affect. Her behavior is normal. Judgment and thought content normal.          Assessment & Plan:

## 2011-10-29 NOTE — Assessment & Plan Note (Signed)
Patient with significant anxiety, particularly prior to her menstrual cycle. Improved with Lexapro and use of Xanax. We'll continue. Followup one month.

## 2011-11-25 ENCOUNTER — Encounter: Payer: Self-pay | Admitting: Internal Medicine

## 2011-11-25 ENCOUNTER — Telehealth: Payer: Self-pay | Admitting: Internal Medicine

## 2011-11-25 NOTE — Telephone Encounter (Signed)
Patient has panic attacks would like a note to be excused from jury duty for that reason.

## 2011-11-25 NOTE — Telephone Encounter (Signed)
Is this ok or does patient need an appt?

## 2011-11-25 NOTE — Telephone Encounter (Signed)
Routed you a letter for jury duty. I doubt they will excuse her for this.

## 2011-11-25 NOTE — Telephone Encounter (Signed)
Left message on voicemail at work advising patient that letter is ready for pick up will be left at front desk.

## 2011-12-10 ENCOUNTER — Ambulatory Visit (INDEPENDENT_AMBULATORY_CARE_PROVIDER_SITE_OTHER): Payer: BC Managed Care – PPO | Admitting: Internal Medicine

## 2011-12-10 ENCOUNTER — Encounter: Payer: Self-pay | Admitting: Internal Medicine

## 2011-12-10 VITALS — BP 122/84 | HR 82 | Temp 98.6°F | Ht 66.0 in | Wt 185.8 lb

## 2011-12-10 DIAGNOSIS — E669 Obesity, unspecified: Secondary | ICD-10-CM

## 2011-12-10 DIAGNOSIS — J309 Allergic rhinitis, unspecified: Secondary | ICD-10-CM

## 2011-12-10 DIAGNOSIS — Z0000 Encounter for general adult medical examination without abnormal findings: Secondary | ICD-10-CM | POA: Insufficient documentation

## 2011-12-10 DIAGNOSIS — E785 Hyperlipidemia, unspecified: Secondary | ICD-10-CM

## 2011-12-10 DIAGNOSIS — R7989 Other specified abnormal findings of blood chemistry: Secondary | ICD-10-CM

## 2011-12-10 MED ORDER — DESLORATADINE 5 MG PO TABS: 5.0000 mg | ORAL_TABLET | ORAL | Status: DC | PRN

## 2011-12-10 MED ORDER — ROSUVASTATIN CALCIUM 5 MG PO TABS: 5.0000 mg | ORAL_TABLET | Freq: Every day | ORAL | Status: DC

## 2011-12-10 NOTE — Assessment & Plan Note (Signed)
Symptoms well controlled with Clarinex. Will continue.

## 2011-12-10 NOTE — Assessment & Plan Note (Signed)
LFTs noted to be elevated on last check. We'll plan to repeat today.
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
## 2011-12-10 NOTE — Assessment & Plan Note (Signed)
Congratulated patient on 15 pound weight loss. Encouraged her to continue efforts at healthy diet and regular physical activity. Will continue phentermine to help with appetite suppression. Followup in 3 months or sooner as needed.

## 2011-12-10 NOTE — Assessment & Plan Note (Addendum)
Gen med exam normal today. PAP/pelvic and breast exam deferred as recently performed by OB/GYN. Reviewed recent September 2013 which showed slight elevation of LFTs. We'll plan to repeat LFTs today. Encouraged patient to continue efforts at weight loss. Congratulated her on recent 15 pound weight loss. Immunizations are up to date. Followup in 3 months or sooner as needed.

## 2011-12-16 ENCOUNTER — Other Ambulatory Visit (INDEPENDENT_AMBULATORY_CARE_PROVIDER_SITE_OTHER): Payer: BC Managed Care – PPO

## 2011-12-16 DIAGNOSIS — E785 Hyperlipidemia, unspecified: Secondary | ICD-10-CM

## 2011-12-16 LAB — COMPREHENSIVE METABOLIC PANEL
Alkaline Phosphatase: 70 U/L (ref 39–117)
Creatinine, Ser: 0.8 mg/dL (ref 0.4–1.2)
Glucose, Bld: 75 mg/dL (ref 70–99)
Sodium: 138 mEq/L (ref 135–145)
Total Bilirubin: 0.4 mg/dL (ref 0.3–1.2)
Total Protein: 7.3 g/dL (ref 6.0–8.3)

## 2011-12-16 LAB — LIPID PANEL
Cholesterol: 119 mg/dL (ref 0–200)
HDL: 25.7 mg/dL — ABNORMAL LOW (ref >39.00)
Total CHOL/HDL Ratio: 5
Triglycerides: 239 mg/dL — ABNORMAL HIGH (ref 0.0–149.0)
VLDL: 47.8 mg/dL — ABNORMAL HIGH (ref 0.0–40.0)

## 2011-12-22 ENCOUNTER — Ambulatory Visit: Payer: BC Managed Care – PPO | Admitting: Internal Medicine

## 2011-12-27 ENCOUNTER — Ambulatory Visit (INDEPENDENT_AMBULATORY_CARE_PROVIDER_SITE_OTHER): Payer: BC Managed Care – PPO | Admitting: Internal Medicine

## 2011-12-27 ENCOUNTER — Encounter: Payer: Self-pay | Admitting: Internal Medicine

## 2011-12-27 VITALS — BP 112/74 | HR 79 | Temp 97.9°F | Ht 66.0 in | Wt 182.8 lb

## 2011-12-27 DIAGNOSIS — R7989 Other specified abnormal findings of blood chemistry: Secondary | ICD-10-CM

## 2011-12-27 DIAGNOSIS — E785 Hyperlipidemia, unspecified: Secondary | ICD-10-CM

## 2011-12-27 MED ORDER — EPINEPHRINE 0.3 MG/0.3ML IJ DEVI: 0.3000 mg | Freq: Once | INTRAMUSCULAR | Status: DC

## 2011-12-27 NOTE — Progress Notes (Signed)
Subjective:    Patient ID: Diane Hahn, female    DOB: 02-24-1970, 41 y.o.   MRN: [id number]  HPI 41 year old female presents for followup of recent lab work. Labs are remarkable for mild elevation of liver function tests. Liver function tests were also noted to be elevated in September 2013. Patient denies any symptoms such as abdominal distention, change in bowel habits, easy bleeding or bruising. She reports previous vaccination against hepatitis B. She denies any family history of liver dysfunction. She had a liver ultrasound in 2012 which was normal. She reports moderate intake of alcohol, approximately 2 glasses of wine 2 times per week. She also occasionally uses Tylenol.  Outpatient Encounter Prescriptions as of 12/27/2011  Medication Sig Dispense Refill  . ALPRAZolam (XANAX) 0.5 MG tablet Take 1 tablet (0.5 mg total) by mouth 3 (three) times daily as needed for sleep.  60 tablet  3  . desloratadine (CLARINEX) 5 MG tablet Take 1 tablet (5 mg total) by mouth as needed.  30 tablet  11  . EPINEPHrine (EPI-PEN) 0.3 mg/0.3 mL DEVI Inject 0.3 mLs (0.3 mg total) into the muscle once.  1 Device  6  . escitalopram (LEXAPRO) 20 MG tablet TAKE 1 TABLET BY MOUTH EVERY DAY  30 tablet  4  . phentermine 37.5 MG capsule Take 1 capsule (37.5 mg total) by mouth every morning.  30 capsule  1  . rosuvastatin (CRESTOR) 5 MG tablet Take 1 tablet (5 mg total) by mouth daily.  30 tablet  11  . [DISCONTINUED] EPINEPHrine (EPI-PEN) 0.3 mg/0.3 mL DEVI Inject 0.3 mg into the muscle once.       BP 112/74  Pulse 79  Temp 97.9 F (36.6 C) (Oral)  Ht 5\' 6"  (1.676 m)  Wt 182 lb 12 oz (82.895 kg)  BMI 29.50 kg/m2  SpO2 98%  Review of Systems  Constitutional: Negative for fever, chills, appetite change, fatigue and unexpected weight change.  HENT: Negative for ear pain, congestion, sore throat, trouble swallowing, neck pain, voice change and sinus pressure.   Eyes: Negative for visual disturbance.  Respiratory:  Negative for cough, shortness of breath, wheezing and stridor.   Cardiovascular: Negative for chest pain, palpitations and leg swelling.  Gastrointestinal: Negative for nausea, vomiting, abdominal pain, diarrhea, constipation, blood in stool, abdominal distention and anal bleeding.  Genitourinary: Negative for dysuria and flank pain.  Musculoskeletal: Negative for myalgias, arthralgias and gait problem.  Skin: Negative for color change and rash.  Neurological: Negative for dizziness and headaches.  Hematological: Negative for adenopathy. Does not bruise/bleed easily.  Psychiatric/Behavioral: Negative for suicidal ideas, sleep disturbance and dysphoric mood. The patient is not nervous/anxious.        Objective:   Physical Exam  Constitutional: She is oriented to person, place, and time. She appears well-developed and well-nourished. No distress.  HENT:  Head: Normocephalic and atraumatic.  Right Ear: External ear normal.  Left Ear: External ear normal.  Nose: Nose normal.  Mouth/Throat: Oropharynx is clear and moist.  Eyes: Conjunctivae normal are normal. Pupils are equal, round, and reactive to light. Right eye exhibits no discharge. Left eye exhibits no discharge. No scleral icterus.  Neck: Normal range of motion. Neck supple. No tracheal deviation present. No thyromegaly present.  Cardiovascular: Normal rate, regular rhythm, normal heart sounds and intact distal pulses.  Exam reveals no gallop and no friction rub.   No murmur heard. Pulmonary/Chest: Effort normal and breath sounds normal. No respiratory distress. She has no wheezes. She has no  rales. She exhibits no tenderness.  Abdominal: Soft. Normal appearance and bowel sounds are normal. There is no hepatosplenomegaly. There is no tenderness.  Musculoskeletal: Normal range of motion. She exhibits no edema and no tenderness.  Lymphadenopathy:    She has no cervical adenopathy.  Neurological: She is alert and oriented to person,  place, and time. No cranial nerve deficit. She exhibits normal muscle tone. Coordination normal.  Skin: Skin is warm and dry. No rash noted. She is not diaphoretic. No erythema. No pallor.  Psychiatric: She has a normal mood and affect. Her behavior is normal. Judgment and thought content normal.          Assessment & Plan:

## 2011-12-27 NOTE — Assessment & Plan Note (Signed)
Congratulated patient on marked improvement in lipids. Encouraged her to continue efforts at healthy diet and regular physical activity. Will continue Crestor.

## 2011-12-27 NOTE — Assessment & Plan Note (Addendum)
Patient noted to have mild persistent elevation of AST and ALT. Reviewed potential causes for transaminitis today. Will send additional labs including testing for viral hepatitis, Wilson's disease, autoimmune hepatitis. Encouraged patient to limit use of alcohol. Encouraged her to limit use of Tylenol. Reviewed recent right upper quadrant ultrasound from 2012 which showed normal liver. If lab evaluation is normal, would favor repeat LFTs in 3 months.

## 2011-12-28 LAB — HEPATIC FUNCTION PANEL
ALT: 30 U/L (ref 0–35)
AST: 36 U/L (ref 0–37)
Albumin: 4.5 g/dL (ref 3.5–5.2)
Alkaline Phosphatase: 84 U/L (ref 39–117)
Bilirubin, Direct: 0.2 mg/dL (ref 0.0–0.3)
Total Protein: 7.5 g/dL (ref 6.0–8.3)

## 2011-12-28 LAB — PROTIME-INR: INR: 0.9 ratio (ref 0.8–1.0)

## 2011-12-28 LAB — HEPATITIS C ANTIBODY: HCV Ab: NEGATIVE

## 2011-12-28 LAB — HEPATITIS B SURFACE ANTIBODY,QUALITATIVE: Hep B S Ab: POSITIVE — AB

## 2011-12-30 LAB — ANTI-MICROSOMAL ANTIBODY LIVER / KIDNEY: LKM1 Ab: <=20 U (ref <=20.0)

## 2011-12-31 ENCOUNTER — Other Ambulatory Visit: Payer: Self-pay | Admitting: General Practice

## 2011-12-31 DIAGNOSIS — E669 Obesity, unspecified: Secondary | ICD-10-CM

## 2011-12-31 MED ORDER — PHENTERMINE HCL 37.5 MG PO CAPS: 37.5000 mg | ORAL_CAPSULE | ORAL | Status: DC

## 2011-12-31 NOTE — Telephone Encounter (Signed)
Phentermine last filled 10/29/11 qty 30 1 refill. Pt last seen on 11/18. Ok to refill?

## 2011-12-31 NOTE — Telephone Encounter (Signed)
Fine to refill 

## 2011-12-31 NOTE — Telephone Encounter (Signed)
Med called in

## 2012-01-23 ENCOUNTER — Emergency Department: Payer: Self-pay | Admitting: Emergency Medicine

## 2012-01-23 LAB — URINALYSIS, COMPLETE
Glucose,UR: NEGATIVE mg/dL (ref 0–75)
Leukocyte Esterase: NEGATIVE
Protein: NEGATIVE
Specific Gravity: 1.025 (ref 1.003–1.030)
Squamous Epithelial: 2

## 2012-01-23 LAB — COMPREHENSIVE METABOLIC PANEL
Albumin: 4.6 g/dL (ref 3.4–5.0)
Anion Gap: 12 (ref 7–16)
Calcium, Total: 9.2 mg/dL (ref 8.5–10.1)
Chloride: 105 mmol/L (ref 98–107)
Co2: 22 mmol/L (ref 21–32)
Creatinine: 0.85 mg/dL (ref 0.60–1.30)
EGFR (African American): >60
EGFR (Non-African Amer.): >60
Glucose: 171 mg/dL — ABNORMAL HIGH (ref 65–99)
Osmolality: 280 (ref 275–301)
Potassium: 3.8 mmol/L (ref 3.5–5.1)
SGOT(AST): 43 U/L — ABNORMAL HIGH (ref 15–37)
Sodium: 139 mmol/L (ref 136–145)

## 2012-01-23 LAB — CBC WITH DIFFERENTIAL/PLATELET
Basophil #: 0 10*3/uL (ref 0.0–0.1)
Basophil %: 0.2 %
Eosinophil %: 0.5 %
HCT: 42.6 % (ref 35.0–47.0)
HGB: 14.7 g/dL (ref 12.0–16.0)
Lymphocyte #: 0.4 10*3/uL — ABNORMAL LOW (ref 1.0–3.6)
Lymphocyte %: 4.6 %
MCV: 93 fL (ref 80–100)
Monocyte %: 5.8 %
Neutrophil #: 8.1 10*3/uL — ABNORMAL HIGH (ref 1.4–6.5)
Platelet: 223 10*3/uL (ref 150–440)
RBC: 4.59 10*6/uL (ref 3.80–5.20)
WBC: 9.2 10*3/uL (ref 3.6–11.0)

## 2012-01-23 LAB — LIPASE, BLOOD: Lipase: 105 U/L (ref 73–393)

## 2012-03-16 ENCOUNTER — Ambulatory Visit: Payer: BC Managed Care – PPO | Admitting: Internal Medicine

## 2012-05-04 ENCOUNTER — Ambulatory Visit (INDEPENDENT_AMBULATORY_CARE_PROVIDER_SITE_OTHER): Payer: BC Managed Care – PPO | Admitting: Internal Medicine

## 2012-05-04 ENCOUNTER — Telehealth: Payer: Self-pay | Admitting: *Deleted

## 2012-05-04 ENCOUNTER — Encounter: Payer: Self-pay | Admitting: Internal Medicine

## 2012-05-04 ENCOUNTER — Ambulatory Visit: Payer: Self-pay | Admitting: Internal Medicine

## 2012-05-04 VITALS — BP 120/78 | HR 85 | Temp 97.6°F | Wt 168.0 lb

## 2012-05-04 DIAGNOSIS — R109 Unspecified abdominal pain: Secondary | ICD-10-CM | POA: Insufficient documentation

## 2012-05-04 DIAGNOSIS — R52 Pain, unspecified: Secondary | ICD-10-CM

## 2012-05-04 DIAGNOSIS — R112 Nausea with vomiting, unspecified: Secondary | ICD-10-CM | POA: Insufficient documentation

## 2012-05-04 LAB — POCT URINALYSIS DIPSTICK
Leukocytes, UA: NEGATIVE
Nitrite, UA: NEGATIVE
Protein, UA: 30
Urobilinogen, UA: 1
pH, UA: 5.5

## 2012-05-04 LAB — COMPREHENSIVE METABOLIC PANEL
ALT: 19 U/L (ref 0–35)
CO2: 24 mEq/L (ref 19–32)
Calcium: 8.9 mg/dL (ref 8.4–10.5)
Chloride: 100 mEq/L (ref 96–112)
GFR: 69 mL/min (ref >60.00)
Sodium: 133 mEq/L — ABNORMAL LOW (ref 135–145)
Total Protein: 7.7 g/dL (ref 6.0–8.3)

## 2012-05-04 LAB — CBC WITH DIFFERENTIAL/PLATELET
Basophils Absolute: 0 10*3/uL (ref 0.0–0.1)
Eosinophils Absolute: 0 10*3/uL (ref 0.0–0.7)
HCT: 43 % (ref 36.0–46.0)
Lymphs Abs: 0.6 10*3/uL — ABNORMAL LOW (ref 0.7–4.0)
MCHC: 34.2 g/dL (ref 30.0–36.0)
Monocytes Relative: 6.6 % (ref 3.0–12.0)
Platelets: 206 10*3/uL (ref 150.0–400.0)
RDW: 13.4 % (ref 11.5–14.6)

## 2012-05-04 LAB — LIPASE: Lipase: 17 U/L (ref 11.0–59.0)

## 2012-05-04 MED ORDER — PANTOPRAZOLE SODIUM 40 MG PO TBEC: 40.0000 mg | DELAYED_RELEASE_TABLET | Freq: Every day | ORAL | Status: DC

## 2012-05-04 MED ORDER — ONDANSETRON HCL 8 MG PO TABS: 8.0000 mg | ORAL_TABLET | Freq: Three times a day (TID) | ORAL | Status: DC | PRN

## 2012-05-04 NOTE — Telephone Encounter (Signed)
I think that her current symptoms of nausea and diarrhea may be related to a viral infection. I am still waiting on one lab test, the H. Pylori.I would recommend that she use the Zofran for nausea and try to increase intake of clear liquids as much as tolerated.  If symptoms persist over the next 24-48hr or worsen, she may ultimately need admission for IVF.   If symptoms are not improving over next few days, we will also need to set up evaluation with GI.

## 2012-05-04 NOTE — Progress Notes (Signed)
Subjective:    Patient ID: Diane Hahn, female    DOB: 05-03-1970, 42 y.o.   MRN: [id number]  HPI 42 year old female presents for acute visit complaining of 2-3 month history of intermittent episodes of abdominal pain associated with nausea, vomiting. These episodes sometimes wake her from sleep. They do not seem to be associated with particular foods. They resolve in about 12 hours each time. She has been taking Zofran with improvement in nausea. Most recent episode started yesterday. This episode was unique in its persistence and concomitant symptom of diarrhea. Patient notes that emesis typically consist of food particles. She denies hematemesis. Diarrhea with this episode has been watery. She denies any blood in her stool. She denies black stools. Abdominal pain is described as generally diffuse but sometimes more pronounced in the epigastric area. She feels as if there is a "weight "over her abdomen. She notes that in the distant past, she was evaluated for cholecystitis with right upper quadrant ultrasound which was normal. This was approximately 2 years ago. She denies any recent fever, chills, weight change. She denies any urinary symptoms.  Outpatient Encounter Prescriptions as of 05/04/2012  Medication Sig Dispense Refill  . EPINEPHrine (EPI-PEN) 0.3 mg/0.3 mL DEVI Inject 0.3 mLs (0.3 mg total) into the muscle once.  1 Device  6  . escitalopram (LEXAPRO) 20 MG tablet TAKE 1 TABLET BY MOUTH EVERY DAY  30 tablet  4  . etodolac (LODINE) 400 MG tablet Take 400 mg by mouth 2 (two) times daily.      Marland Kitchen ALPRAZolam (XANAX) 0.5 MG tablet Take 1 tablet (0.5 mg total) by mouth 3 (three) times daily as needed for sleep.  60 tablet  3  . desloratadine (CLARINEX) 5 MG tablet Take 1 tablet (5 mg total) by mouth as needed.  30 tablet  11  . ondansetron (ZOFRAN) 8 MG tablet Take 1 tablet (8 mg total) by mouth every 8 (eight) hours as needed for nausea.  60 tablet  1  . pantoprazole (PROTONIX) 40 MG tablet Take  1 tablet (40 mg total) by mouth daily.  30 tablet  3  . phentermine 37.5 MG capsule Take 1 capsule (37.5 mg total) by mouth every morning.  30 capsule  1  . rosuvastatin (CRESTOR) 5 MG tablet Take 1 tablet (5 mg total) by mouth daily.  30 tablet  11   No facility-administered encounter medications on file as of 05/04/2012.   BP 120/78  Pulse 85  Temp(Src) 97.6 F (36.4 C) (Oral)  Wt 168 lb (76.204 kg)  BMI 27.13 kg/m2  SpO2 97%  Review of Systems  Constitutional: Negative for fever, chills, appetite change, fatigue and unexpected weight change.  HENT: Negative for ear pain, congestion, sore throat, trouble swallowing, neck pain, voice change and sinus pressure.   Eyes: Negative for visual disturbance.  Respiratory: Negative for cough, shortness of breath, wheezing and stridor.   Cardiovascular: Negative for chest pain, palpitations and leg swelling.  Gastrointestinal: Positive for nausea, vomiting, abdominal pain and diarrhea. Negative for constipation, blood in stool, abdominal distention and anal bleeding.  Genitourinary: Negative for dysuria and flank pain.  Musculoskeletal: Negative for myalgias, arthralgias and gait problem.  Skin: Negative for color change and rash.  Neurological: Negative for dizziness and headaches.  Hematological: Negative for adenopathy. Does not bruise/bleed easily.  Psychiatric/Behavioral: Negative for suicidal ideas, sleep disturbance and dysphoric mood. The patient is not nervous/anxious.        Objective:   Physical Exam  Constitutional: She  is oriented to person, place, and time. She appears well-developed and well-nourished. No distress.  HENT:  Head: Normocephalic and atraumatic.  Right Ear: External ear normal.  Left Ear: External ear normal.  Nose: Nose normal.  Mouth/Throat: Oropharynx is clear and moist. No oropharyngeal exudate.  Eyes: Conjunctivae are normal. Pupils are equal, round, and reactive to light. Right eye exhibits no discharge.  Left eye exhibits no discharge. No scleral icterus.  Neck: Normal range of motion. Neck supple. No tracheal deviation present. No thyromegaly present.  Cardiovascular: Normal rate, regular rhythm, normal heart sounds and intact distal pulses.  Exam reveals no gallop and no friction rub.   No murmur heard. Pulmonary/Chest: Effort normal and breath sounds normal. No respiratory distress. She has no wheezes. She has no rales. She exhibits no tenderness.  Abdominal: Normal appearance and bowel sounds are normal. There is no hepatosplenomegaly. There is tenderness in the epigastric area. There is no CVA tenderness.  Musculoskeletal: Normal range of motion. She exhibits no edema and no tenderness.  Lymphadenopathy:    She has no cervical adenopathy.  Neurological: She is alert and oriented to person, place, and time. No cranial nerve deficit. She exhibits normal muscle tone. Coordination normal.  Skin: Skin is warm and dry. No rash noted. She is not diaphoretic. No erythema. No pallor.  Psychiatric: She has a normal mood and affect. Her behavior is normal. Judgment and thought content normal.          Assessment & Plan:

## 2012-05-04 NOTE — Telephone Encounter (Signed)
Can you please let her know that the CT was okay on prelim report?

## 2012-05-04 NOTE — Telephone Encounter (Signed)
Marzella Schlein from [facility] called to give a call report. No acute findings of the abdomen and pelvic

## 2012-05-04 NOTE — Assessment & Plan Note (Signed)
2-3 month history of intermittent abdominal pain and nausea/vomiting. Symptoms are concerning for chronic pancreatitis. Will check CMP, lipase, CBC with labs today. Given marked tenderness in the epigastric area on exam, will get CT of the abdomen and pelvis for further evaluation. We'll use Zofran as needed for nausea. Will start pantoprazole 40 mg daily. Will check for H. pylori with labs. Followup in one week or sooner as needed. We discussed that if symptoms are persistent imaging is unremarkable, she will need to have upper endoscopy for further evaluation.

## 2012-05-04 NOTE — Telephone Encounter (Signed)
Patient informed and verbalized understanding, however she said she is still not feeling any better. Would like to know what she should do? Still having the nausea and diarrhea and it is getting worse.

## 2012-05-05 NOTE — Telephone Encounter (Signed)
Patient informed of plan and verbalized understanding.

## 2012-05-08 ENCOUNTER — Other Ambulatory Visit: Payer: Self-pay | Admitting: *Deleted

## 2012-05-08 ENCOUNTER — Telehealth: Payer: Self-pay | Admitting: Internal Medicine

## 2012-05-08 DIAGNOSIS — R197 Diarrhea, unspecified: Secondary | ICD-10-CM

## 2012-05-08 NOTE — Telephone Encounter (Signed)
TC to patient regarding update in condition/report.  On callback, unable to reach patient at number given; message left on identified voicemail to contact office.  krs/can

## 2012-05-08 NOTE — Telephone Encounter (Signed)
This pt had dropped of a stool pack, but there was no orders put in? What labs did you what?

## 2012-05-08 NOTE — Telephone Encounter (Signed)
Stool culture, o and p, and testing for CDiff.

## 2012-05-10 LAB — OVA AND PARASITE SCREEN

## 2012-05-10 LAB — CLOSTRIDIUM DIFFICILE EIA: CDIFTX: NEGATIVE

## 2012-05-11 ENCOUNTER — Telehealth: Payer: Self-pay | Admitting: *Deleted

## 2012-05-11 MED ORDER — CLARITHROMYCIN 500 MG PO TABS: 500.0000 mg | ORAL_TABLET | Freq: Two times a day (BID) | ORAL | Status: DC

## 2012-05-11 MED ORDER — METRONIDAZOLE 500 MG PO TABS: 500.0000 mg | ORAL_TABLET | Freq: Two times a day (BID) | ORAL | Status: DC

## 2012-05-11 NOTE — Telephone Encounter (Signed)
Patient is allergic to Penicillin, what are the alternatives? Send to [facility] [address]  Notes Recorded by Wynona Dove, MD on 05/10/2012 at 4:16 PM Labs are positive for H. Pylori. We need to let her know and call in a Prevpac. Please confirm no penicillin allergy. Notes Recorded by Wynona Dove, MD on 05/04/2012 at 4:05 PM Labs are normal. I am still waiting on test of H. Pylori.

## 2012-05-11 NOTE — Telephone Encounter (Signed)
Patient informed and she already has some Pantoprazole at home so did not need any sent to the pharmacy. Prescriptions sent to pharmacy on file and will call back once she has completed course of treatment for orders to be sent to [facility].

## 2012-05-11 NOTE — Telephone Encounter (Signed)
She will need to take: Pantoprazole 40mg  po bid x 2 weeks Clarithromycin 500mg  po bid x 2 weeks Metronidazole 500mg  po bid x 2 weeks  Please call this in. After completing, she will need to have a breath test at [facility] to demonstrate cure.

## 2012-05-11 NOTE — Telephone Encounter (Deleted)
Message copied by Theola Sequin on Thu May 11, 2012  8:55 AM ------      Message from: Ronna Polio A      Created: Thu May 04, 2012 11:51 AM       Can you send for culture? ------

## 2012-05-17 ENCOUNTER — Telehealth: Payer: Self-pay | Admitting: Internal Medicine

## 2012-05-17 NOTE — Telephone Encounter (Signed)
Patient Information:  Caller Name: [name]  Phone: [phone]  Patient: Diane, Hahn  Gender: Female  DOB: 10/20/1970  Age: 42 Years  PCP: Ronna Polio (Adults only)  Pregnant: No  Office Follow Up:  Does the office need to follow up with this patient?: No  Instructions For The Office: N/A   Symptoms  Reason For Call & Symptoms: Violent vomiting, diarrhea x5 days then seen in office, CT Scan then H.Pylori came back positive on 4/3 so started Biaxin, Flagyl, Protonix.  Not feeling well, only eating small amounts.  Cough started 3 days ago Sun 4/6.  Has been waking at night due to dry nose, throat.  Very restless since Sun, no energy.  Feels like trying to cough something out of lower throat but can't get anything out.  Reviewed Health History In EMR: Yes  Reviewed Medications In EMR: Yes  Reviewed Allergies In EMR: Yes  Reviewed Surgeries / Procedures: Yes  Date of Onset of Symptoms: 05/14/2012 OB / GYN:  LMP: 04/28/2012  Guideline(s) Used:  Cough  Disposition Per Guideline:   See Today or Tomorrow in Office  Reason For Disposition Reached:   Patient wants to be seen  Advice Given:  Coughing Spasms:  Drink warm fluids. Inhale warm mist (Reason: both relax the airway and loosen up the phlegm).  Suck on cough drops or hard candy to coat the irritated throat.  Prevent Dehydration:  Drink adequate liquids.  This will help soothe an irritated or dry throat and loosen up the phlegm.  Call Back If:  You become worse  Patient Will Follow Care Advice:  YES  Appointment Scheduled:  05/18/2012 10:15:00 Appointment Scheduled Provider:  Orville Govern

## 2012-05-18 ENCOUNTER — Ambulatory Visit (INDEPENDENT_AMBULATORY_CARE_PROVIDER_SITE_OTHER): Payer: BC Managed Care – PPO | Admitting: Adult Health

## 2012-05-18 ENCOUNTER — Encounter: Payer: Self-pay | Admitting: Adult Health

## 2012-05-18 ENCOUNTER — Encounter: Payer: Self-pay | Admitting: Internal Medicine

## 2012-05-18 VITALS — BP 120/80 | HR 97 | Temp 97.6°F | Resp 14 | Ht 66.0 in | Wt 163.2 lb

## 2012-05-18 DIAGNOSIS — R05 Cough: Secondary | ICD-10-CM

## 2012-05-18 MED ORDER — DEXTROMETHORPHAN POLISTIREX 30 MG/5ML PO LQCR: 60.0000 mg | ORAL | Status: DC | PRN

## 2012-05-18 NOTE — Progress Notes (Signed)
  Subjective:    Patient ID: Diane Hahn, female    DOB: April 12, 1970, 42 y.o.   MRN: [id number]  HPI  Patient presents to clinic with cough since Monday. She also felt some sinus pressure. She is currently being treated with antibiotic for positive H. Pylori. She denies fever, chills. Her cough is worse at night when she's lying down. She has been taking Tylenol for the sinus pressure.  Current Outpatient Prescriptions on File Prior to Visit  Medication Sig Dispense Refill  . ALPRAZolam (XANAX) 0.5 MG tablet Take 1 tablet (0.5 mg total) by mouth 3 (three) times daily as needed for sleep.  60 tablet  3  . clarithromycin (BIAXIN) 500 MG tablet Take 1 tablet (500 mg total) by mouth 2 (two) times daily.  28 tablet  0  . desloratadine (CLARINEX) 5 MG tablet Take 1 tablet (5 mg total) by mouth as needed.  30 tablet  11  . EPINEPHrine (EPI-PEN) 0.3 mg/0.3 mL DEVI Inject 0.3 mLs (0.3 mg total) into the muscle once.  1 Device  6  . escitalopram (LEXAPRO) 20 MG tablet TAKE 1 TABLET BY MOUTH EVERY DAY  30 tablet  4  . metroNIDAZOLE (FLAGYL) 500 MG tablet Take 1 tablet (500 mg total) by mouth 2 (two) times daily.  28 tablet  0  . pantoprazole (PROTONIX) 40 MG tablet Take 1 tablet (40 mg total) by mouth daily.  30 tablet  3  . etodolac (LODINE) 400 MG tablet Take 400 mg by mouth 2 (two) times daily.      . ondansetron (ZOFRAN) 8 MG tablet Take 1 tablet (8 mg total) by mouth every 8 (eight) hours as needed for nausea.  60 tablet  1  . phentermine 37.5 MG capsule Take 1 capsule (37.5 mg total) by mouth every morning.  30 capsule  1  . rosuvastatin (CRESTOR) 5 MG tablet Take 1 tablet (5 mg total) by mouth daily.  30 tablet  11   No current facility-administered medications on file prior to visit.     Review of Systems  Constitutional: Negative for fever and chills.  HENT: Positive for sinus pressure. Negative for congestion, rhinorrhea and postnasal drip.   Respiratory: Positive for cough. Negative for  chest tightness, shortness of breath and wheezing.        Objective:   Physical Exam        Assessment & Plan:

## 2012-05-18 NOTE — Assessment & Plan Note (Signed)
Cough worse at night when she's lying down. I suspect that this may be related to GERD. She is currently being treated for H. pylori and will complete therapy a week from today. I am not able to give her any prescription cough medication as this will interact with one of the medications that she is currently on. I have recommended that she tried cough drops and over-the-counter cough medicines such as Delsym. I have also instructed her to elevate her head when sleeping. If symptoms persist after she has completed treatment for H. pylori, she is to call the office.

## 2012-05-18 NOTE — Patient Instructions (Addendum)
  Continue to use cough drops and OTC cough medicine.  Try Delsym which seems to last a little longer.   Do not take any products with alcohol in them as this will cause a reaction with the current treatment for H. Pylori   Stay hydrated.

## 2012-05-24 ENCOUNTER — Telehealth: Payer: Self-pay

## 2012-05-24 NOTE — Telephone Encounter (Signed)
Returned patient phone call, left message for her to call office back to clarify the message she left on the office voicemail.

## 2012-06-13 ENCOUNTER — Other Ambulatory Visit: Payer: Self-pay | Admitting: Internal Medicine

## 2012-06-13 NOTE — Telephone Encounter (Signed)
Rx sent to pharmacy by escript  

## 2012-07-26 ENCOUNTER — Telehealth: Payer: Self-pay | Admitting: *Deleted

## 2012-07-26 DIAGNOSIS — F419 Anxiety disorder, unspecified: Secondary | ICD-10-CM

## 2012-07-26 MED ORDER — ALPRAZOLAM 0.5 MG PO TABS: 0.5000 mg | ORAL_TABLET | Freq: Three times a day (TID) | ORAL | Status: DC | PRN

## 2012-07-26 NOTE — Telephone Encounter (Signed)
Patient left a voicemail stating her mother had a heart attack over the weekend and was rushed to the hospital. Now she is having panic attacks, would like to know if something could be called in for her or does she need to make an appointment to discuss. She was given Xanax in September of last year.

## 2012-07-26 NOTE — Telephone Encounter (Signed)
Given the current situation, it would be fine to refill her Xanax. She should be cautious using this medication as it may cause drowsiness. If symptoms of anxiety are persisting, we should see her back in clinic.

## 2012-07-26 NOTE — Telephone Encounter (Signed)
Patient informed and prescription sent to pharmacy 

## 2012-08-28 ENCOUNTER — Other Ambulatory Visit: Payer: Self-pay | Admitting: Internal Medicine

## 2012-08-29 NOTE — Telephone Encounter (Signed)
Eprescribed.

## 2012-12-13 ENCOUNTER — Encounter: Payer: BC Managed Care – PPO | Admitting: Internal Medicine

## 2013-01-02 ENCOUNTER — Other Ambulatory Visit: Payer: Self-pay | Admitting: Internal Medicine

## 2013-01-08 ENCOUNTER — Encounter: Payer: Self-pay | Admitting: Internal Medicine

## 2013-01-08 ENCOUNTER — Ambulatory Visit (INDEPENDENT_AMBULATORY_CARE_PROVIDER_SITE_OTHER): Payer: BC Managed Care – PPO | Admitting: Internal Medicine

## 2013-01-08 VITALS — BP 110/80 | HR 65 | Temp 97.6°F | Ht 65.0 in | Wt 177.0 lb

## 2013-01-08 DIAGNOSIS — E669 Obesity, unspecified: Secondary | ICD-10-CM

## 2013-01-08 DIAGNOSIS — E785 Hyperlipidemia, unspecified: Secondary | ICD-10-CM

## 2013-01-08 DIAGNOSIS — F411 Generalized anxiety disorder: Secondary | ICD-10-CM

## 2013-01-08 DIAGNOSIS — A048 Other specified bacterial intestinal infections: Secondary | ICD-10-CM

## 2013-01-08 DIAGNOSIS — F419 Anxiety disorder, unspecified: Secondary | ICD-10-CM

## 2013-01-08 DIAGNOSIS — J309 Allergic rhinitis, unspecified: Secondary | ICD-10-CM

## 2013-01-08 DIAGNOSIS — Z0000 Encounter for general adult medical examination without abnormal findings: Secondary | ICD-10-CM | POA: Insufficient documentation

## 2013-01-08 DIAGNOSIS — Z23 Encounter for immunization: Secondary | ICD-10-CM

## 2013-01-08 LAB — COMPREHENSIVE METABOLIC PANEL
AST: 26 U/L (ref 0–37)
BUN: 12 mg/dL (ref 6–23)
CO2: 25 mEq/L (ref 19–32)
Calcium: 9.4 mg/dL (ref 8.4–10.5)
Chloride: 102 mEq/L (ref 96–112)
Creatinine, Ser: 0.8 mg/dL (ref 0.4–1.2)
GFR: 82.66 mL/min (ref >60.00)
Glucose, Bld: 92 mg/dL (ref 70–99)

## 2013-01-08 LAB — LIPID PANEL
Cholesterol: 196 mg/dL (ref 0–200)
HDL: 41.6 mg/dL (ref >39.00)
Triglycerides: 344 mg/dL — ABNORMAL HIGH (ref 0.0–149.0)

## 2013-01-08 LAB — TSH: TSH: 0.86 u[IU]/mL (ref 0.35–5.50)

## 2013-01-08 LAB — CBC WITH DIFFERENTIAL/PLATELET
Eosinophils Absolute: 0.1 10*3/uL (ref 0.0–0.7)
HCT: 37.9 % (ref 36.0–46.0)
Lymphs Abs: 1.6 10*3/uL (ref 0.7–4.0)
MCHC: 34 g/dL (ref 30.0–36.0)
MCV: 89.8 fl (ref 78.0–100.0)
Monocytes Absolute: 0.4 10*3/uL (ref 0.1–1.0)
Monocytes Relative: 7.4 % (ref 3.0–12.0)
Neutro Abs: 3.8 10*3/uL (ref 1.4–7.7)
Neutrophils Relative %: 63.5 % (ref 43.0–77.0)
Platelets: 253 10*3/uL (ref 150.0–400.0)
RDW: 13 % (ref 11.5–14.6)

## 2013-01-08 MED ORDER — PHENTERMINE HCL 37.5 MG PO CAPS: 37.5000 mg | ORAL_CAPSULE | ORAL | Status: DC

## 2013-01-08 MED ORDER — DESLORATADINE 5 MG PO TABS: 5.0000 mg | ORAL_TABLET | ORAL | Status: DC | PRN

## 2013-01-08 MED ORDER — ROSUVASTATIN CALCIUM 5 MG PO TABS: 5.0000 mg | ORAL_TABLET | Freq: Every day | ORAL | Status: DC

## 2013-01-08 MED ORDER — EPINEPHRINE 0.3 MG/0.3ML IJ SOAJ: INTRAMUSCULAR | Status: DC

## 2013-01-08 MED ORDER — ESCITALOPRAM OXALATE 20 MG PO TABS: 20.0000 mg | ORAL_TABLET | Freq: Every day | ORAL | Status: DC

## 2013-01-08 MED ORDER — ALPRAZOLAM 0.5 MG PO TABS: 0.5000 mg | ORAL_TABLET | Freq: Three times a day (TID) | ORAL | Status: DC | PRN

## 2013-01-08 NOTE — Assessment & Plan Note (Signed)
Wt Readings from Last 3 Encounters:  01/08/13 177 lb (80.287 kg)  05/18/12 163 lb 4 oz (74.05 kg)  05/04/12 168 lb (76.204 kg)   Encouraged healthy diet and regular exercise. Will resume phentermine to help with appetite suppression. Follow up 1 month for BP and weight check.

## 2013-01-08 NOTE — Assessment & Plan Note (Signed)
General medical exam including breast exam normal today. PAP deferred as normal 2012. Mammogram ordered. Will check labs including CBC, CMP, lipids. Encouraged healthy diet and regular exercise.

## 2013-01-08 NOTE — Progress Notes (Signed)
Subjective:    Patient ID: Diane Hahn, female    DOB: [birth year], 42 y.o.   MRN: [id number]  HPI 42 year old female with history of anxiety, obesity, hyperlipidemia presents for annual exam. She reports she is generally feeling well. Has been a stressful year for her. 2 of her uncles passed away. Her mother recently had heart attack. She notes some dietary indiscretion. She has been trying to exercise on a regular basis. She denies any new concerns today. Earlier this year, she was seen for abdominal pain and ultimately found to have H. pylori infection. Symptoms have completely resolved. She has not yet had a test of cure.  Outpatient Encounter Prescriptions as of 01/08/2013  Medication Sig  . ALPRAZolam (XANAX) 0.5 MG tablet Take 1 tablet (0.5 mg total) by mouth 3 (three) times daily as needed for sleep.  Marland Kitchen desloratadine (CLARINEX) 5 MG tablet Take 1 tablet (5 mg total) by mouth as needed.  Marland Kitchen escitalopram (LEXAPRO) 20 MG tablet Take 1 tablet (20 mg total) by mouth daily.  . phentermine 37.5 MG capsule Take 1 capsule (37.5 mg total) by mouth every morning.  . rosuvastatin (CRESTOR) 5 MG tablet Take 1 tablet (5 mg total) by mouth at bedtime.   BP 110/80  Pulse 65  Temp(Src) 97.6 F (36.4 C) (Oral)  Ht 5\' 5"  (1.651 m)  Wt 177 lb (80.287 kg)  BMI 29.45 kg/m2  SpO2 98%  Review of Systems  Constitutional: Negative for fever, chills, appetite change, fatigue and unexpected weight change.  HENT: Negative for congestion, ear pain, sinus pressure, sore throat, trouble swallowing and voice change.   Eyes: Negative for visual disturbance.  Respiratory: Negative for cough, shortness of breath, wheezing and stridor.   Cardiovascular: Negative for chest pain, palpitations and leg swelling.  Gastrointestinal: Negative for nausea, vomiting, abdominal pain, diarrhea, constipation, blood in stool, abdominal distention and anal bleeding.  Genitourinary: Negative for dysuria and flank pain.    Musculoskeletal: Negative for arthralgias, gait problem, myalgias and neck pain.  Skin: Negative for color change and rash.  Neurological: Negative for dizziness and headaches.  Hematological: Negative for adenopathy. Does not bruise/bleed easily.  Psychiatric/Behavioral: Negative for suicidal ideas, sleep disturbance and dysphoric mood. The patient is not nervous/anxious.        Objective:   Physical Exam  Constitutional: She is oriented to person, place, and time. She appears well-developed and well-nourished. No distress.  HENT:  Head: Normocephalic and atraumatic.  Right Ear: External ear normal.  Left Ear: External ear normal.  Nose: Nose normal.  Mouth/Throat: Oropharynx is clear and moist. No oropharyngeal exudate.  Eyes: Conjunctivae are normal. Pupils are equal, round, and reactive to light. Right eye exhibits no discharge. Left eye exhibits no discharge. No scleral icterus.  Neck: Normal range of motion. Neck supple. No tracheal deviation present. No thyromegaly present.  Cardiovascular: Normal rate, regular rhythm, normal heart sounds and intact distal pulses.  Exam reveals no gallop and no friction rub.   No murmur heard. Pulmonary/Chest: Effort normal and breath sounds normal. No accessory muscle usage. Not tachypneic. No respiratory distress. She has no decreased breath sounds. She has no wheezes. She has no rales. She exhibits no tenderness. Right breast exhibits no inverted nipple, no mass, no nipple discharge, no skin change and no tenderness. Left breast exhibits no inverted nipple, no mass, no nipple discharge, no skin change and no tenderness. Breasts are symmetrical.    Abdominal: Soft. Bowel sounds are normal. She exhibits no distension and  no mass. There is no tenderness. There is no rebound and no guarding.  Musculoskeletal: Normal range of motion. She exhibits no edema and no tenderness.  Lymphadenopathy:    She has no cervical adenopathy.  Neurological: She is  alert and oriented to person, place, and time. No cranial nerve deficit. She exhibits normal muscle tone. Coordination normal.  Skin: Skin is warm and dry. No rash noted. She is not diaphoretic. No erythema. No pallor.  Psychiatric: She has a normal mood and affect. Her behavior is normal. Judgment and thought content normal.          Assessment & Plan:

## 2013-01-08 NOTE — Assessment & Plan Note (Signed)
Will send breath test for H. Pylori today to confirm cure after treatment.

## 2013-01-08 NOTE — Progress Notes (Signed)
Pre-visit discussion using our clinic review tool. No additional management support is needed unless otherwise documented below in the visit note.  

## 2013-01-08 NOTE — Assessment & Plan Note (Signed)
Will check lipids and LFTs with labs today. Continue Crestor. 

## 2013-01-08 NOTE — Assessment & Plan Note (Addendum)
Symptoms well controlled with lexapro and prn alprazolam. Will continue.

## 2013-01-09 ENCOUNTER — Encounter: Payer: Self-pay | Admitting: Emergency Medicine

## 2013-01-09 LAB — VITAMIN D 25 HYDROXY (VIT D DEFICIENCY, FRACTURES): Vit D, 25-Hydroxy: 35 ng/mL (ref 30–89)

## 2013-01-25 ENCOUNTER — Telehealth: Payer: Self-pay | Admitting: Internal Medicine

## 2013-01-25 NOTE — Telephone Encounter (Signed)
Recent mammogram from 12/17 requested additional views. Has this been scheduled?

## 2013-01-26 NOTE — Telephone Encounter (Signed)
Pt was scheduled with [facility] on 12/18. Has been completed.

## 2013-02-21 ENCOUNTER — Encounter: Payer: Self-pay | Admitting: Internal Medicine

## 2013-03-26 ENCOUNTER — Ambulatory Visit (INDEPENDENT_AMBULATORY_CARE_PROVIDER_SITE_OTHER): Payer: BC Managed Care – PPO | Admitting: Internal Medicine

## 2013-03-26 ENCOUNTER — Encounter: Payer: Self-pay | Admitting: Internal Medicine

## 2013-03-26 VITALS — BP 124/88 | HR 86 | Temp 98.3°F | Wt 175.0 lb

## 2013-03-26 DIAGNOSIS — J988 Other specified respiratory disorders: Principal | ICD-10-CM

## 2013-03-26 DIAGNOSIS — R509 Fever, unspecified: Secondary | ICD-10-CM

## 2013-03-26 DIAGNOSIS — B9789 Other viral agents as the cause of diseases classified elsewhere: Secondary | ICD-10-CM

## 2013-03-26 DIAGNOSIS — R52 Pain, unspecified: Secondary | ICD-10-CM

## 2013-03-26 LAB — POCT INFLUENZA A/B
Influenza A, POC: NEGATIVE
Influenza B, POC: NEGATIVE

## 2013-03-26 NOTE — Progress Notes (Signed)
HPI  Pt presents to the clinic today with c/o fever, chills, body aches, cough, chest congestion and wheezing. She reports this started yesterday. The cough is unproductive. She has taken Alka Selzter, Sudafed and a nebulizer OTC with some relief. She has had sick contacts- her daughter was recently diagnosed with strep and the flu. She does have a history of allergies but denies asthma.   Review of Systems      Past Medical History  Diagnosis Date  . Panic attack   . HLD (hyperlipidemia)   . Positive TB test     Family History  Problem Relation Age of Onset  . Coronary artery disease      family hx; and of valvular disease  . Sudden death      family hx  . Diabetes      family hx  . Hyperlipidemia      family hx  . Hypertension      family hx  . Cancer      family hx  . Thyroid disease      family hx   . Heart disease Mother 41    s/p stent  . Hyperlipidemia Mother   . Heart disease Father     History   Social History  . Marital Status: Married    Spouse Name: N/A    Number of Children: N/A  . Years of Education: N/A   Occupational History  . Not on file.   Social History Main Topics  . Smoking status: Former Games developer  . Smokeless tobacco: Not on file     Comment: quit in 2008  . Alcohol Use: Yes     Comment: 1-2 glasses of wine with dinner   . Drug Use: No  . Sexual Activity: Not on file   Other Topics Concern  . 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     Allergies  Allergen Reactions  . Cephalexin   . Penicillins      Constitutional: Positive headache, fatigue and fever. Denies abrupt weight changes.  HEENT:  Positive sore throat. Denies eye redness, eye pain, pressure behind the eyes, facial pain, nasal congestion, ear pain, ringing in the ears, wax buildup, runny nose or bloody nose. Respiratory: Positive cough. Denies difficulty breathing or shortness of breath.  Cardiovascular: Denies chest pain, chest  tightness, palpitations or swelling in the hands or feet.   No other specific complaints in a complete review of systems (except as listed in HPI above).  Objective:   BP 124/88  Pulse 86  Temp(Src) 98.3 F (36.8 C) (Oral)  Wt 175 lb (79.379 kg)  SpO2 99% Wt Readings from Last 3 Encounters:  03/26/13 175 lb (79.379 kg)  01/08/13 177 lb (80.287 kg)  05/18/12 163 lb 4 oz (74.05 kg)     General: Appears her stated age, well developed, well nourished in NAD. HEENT: Head: normal shape and size; Eyes: sclera white, no icterus, conjunctiva pink, PERRLA and EOMs intact; Ears: Tm's gray and intact, normal light reflex; Nose: mucosa pink and moist, septum midline; Throat/Mouth: Teeth present, mucosa erythematous L>R and moist, no exudate noted, no lesions or ulcerations noted.  Neck: Neck supple, trachea midline. No massses, lumps or thyromegaly present.  Cardiovascular: Normal rate and rhythm. S1,S2 noted.  No murmur, rubs or gallops noted. No JVD or BLE edema. No carotid bruits noted. Pulmonary/Chest: Normal effort and positive vesicular breath sounds. No respiratory distress. No wheezes, rales or  ronchi noted.      Assessment & Plan:   Upper Respiratory Infection, likely viral at this point:  Rapid Flu: negative Get some rest and drink plenty of water Do salt water gargles for the sore throat Ibuprofen and cough syrup OTC for symptom management  RTC as needed or if symptoms persist.

## 2013-03-26 NOTE — Addendum Note (Signed)
Addended by: Roena MaladyEVONTENNO, Roston Grunewald Y on: 03/26/2013 11:59 AM   Modules accepted: Orders

## 2013-03-26 NOTE — Patient Instructions (Addendum)

## 2013-03-26 NOTE — Progress Notes (Signed)
Pre-visit discussion using our clinic review tool. No additional management support is needed unless otherwise documented below in the visit note.  

## 2013-03-29 ENCOUNTER — Ambulatory Visit (INDEPENDENT_AMBULATORY_CARE_PROVIDER_SITE_OTHER): Payer: BC Managed Care – PPO | Admitting: Internal Medicine

## 2013-03-29 ENCOUNTER — Encounter: Payer: Self-pay | Admitting: Internal Medicine

## 2013-03-29 VITALS — BP 110/90 | HR 78 | Temp 97.7°F | Wt 174.0 lb

## 2013-03-29 DIAGNOSIS — J209 Acute bronchitis, unspecified: Secondary | ICD-10-CM

## 2013-03-29 MED ORDER — HYDROCOD POLST-CHLORPHEN POLST 10-8 MG/5ML PO LQCR: 5.0000 mL | Freq: Two times a day (BID) | ORAL | Status: DC | PRN

## 2013-03-29 MED ORDER — AZITHROMYCIN 250 MG PO TABS: ORAL_TABLET | ORAL | Status: DC

## 2013-03-29 NOTE — Patient Instructions (Signed)
Acute Bronchitis Bronchitis is inflammation of the airways that extend from the windpipe into the lungs (bronchi). The inflammation often causes mucus to develop. This leads to a cough, which is the most common symptom of bronchitis.  In acute bronchitis, the condition usually develops suddenly and goes away over time, usually in a couple weeks. Smoking, allergies, and asthma can make bronchitis worse. Repeated episodes of bronchitis may cause further lung problems.  CAUSES Acute bronchitis is most often caused by the same virus that causes a cold. The virus can spread from person to person (contagious).  SIGNS AND SYMPTOMS   Cough.   Fever.   Coughing up mucus.   Body aches.   Chest congestion.   Chills.   Shortness of breath.   Sore throat.  DIAGNOSIS  Acute bronchitis is usually diagnosed through a physical exam. Tests, such as chest X-rays, are sometimes done to rule out other conditions.  TREATMENT  Acute bronchitis usually goes away in a couple weeks. Often times, no medical treatment is necessary. Medicines are sometimes given for relief of fever or cough. Antibiotics are usually not needed but may be prescribed in certain situations. In some cases, an inhaler may be recommended to help reduce shortness of breath and control the cough. A cool mist vaporizer may also be used to help thin bronchial secretions and make it easier to clear the chest.  HOME CARE INSTRUCTIONS  Get plenty of rest.   Drink enough fluids to keep your urine clear or pale yellow (unless you have a medical condition that requires fluid restriction). Increasing fluids may help thin your secretions and will prevent dehydration.   Only take over-the-counter or prescription medicines as directed by your health care provider.   Avoid smoking and secondhand smoke. Exposure to cigarette smoke or irritating chemicals will make bronchitis worse. If you are a smoker, consider using nicotine gum or skin  patches to help control withdrawal symptoms. Quitting smoking will help your lungs heal faster.   Reduce the chances of another bout of acute bronchitis by washing your hands frequently, avoiding people with cold symptoms, and trying not to touch your hands to your mouth, nose, or eyes.   Follow up with your health care provider as directed.  SEEK MEDICAL CARE IF: Your symptoms do not improve after 1 week of treatment.  SEEK IMMEDIATE MEDICAL CARE IF:  You develop an increased fever or chills.   You have chest pain.   You have severe shortness of breath.  You have bloody sputum.   You develop dehydration.  You develop fainting.  You develop repeated vomiting.  You develop a severe headache. MAKE SURE YOU:   Understand these instructions.  Will watch your condition.  Will get help right away if you are not doing well or get worse. Document Released: 03/04/2004 Document Revised: 09/27/2012 Document Reviewed: 07/18/2012 [employer] Patient Information 2014 [employer].  

## 2013-03-29 NOTE — Assessment & Plan Note (Signed)
Symptoms and exam consistent with acute bronchitis. Recommended rest, adequate fluids, use of tylenol or ibuprofen prn. Will start Azithromycin and Tussionex prn cough. Follow up if symptoms are not improving over next few days.

## 2013-03-29 NOTE — Progress Notes (Signed)
   Subjective:    Patient ID: Diane Hahn, female    DOB: August 26, 1970, 43 y.o.   MRN: [id number]  HPI 43YO female presents for acute visit. Complains of several days of cough, fatigue, fever 99-100. Took sudafed and tylenol and advil with some improvement. Waking up at night with coughing episodes. Some post-tussive emesis. Yesterday, developed "wet" cough productive of thick yellow mucous. Notes some dyspnea. No wheezing. Chest feels "tight" and hurts with coughing. No h/o asthma or tobacco use.  Review of Systems  Constitutional: Positive for fever, chills and fatigue. Negative for unexpected weight change.  HENT: Positive for congestion, postnasal drip and rhinorrhea. Negative for ear discharge, ear pain, facial swelling, hearing loss, mouth sores, nosebleeds, sinus pressure, sneezing, sore throat, tinnitus, trouble swallowing and voice change.   Eyes: Negative for pain, discharge, redness and visual disturbance.  Respiratory: Positive for cough, chest tightness and shortness of breath. Negative for wheezing and stridor.   Cardiovascular: Negative for chest pain, palpitations and leg swelling.  Musculoskeletal: Negative for arthralgias, myalgias, neck pain and neck stiffness.  Skin: Negative for color change and rash.  Neurological: Negative for dizziness, weakness, light-headedness and headaches.  Hematological: Negative for adenopathy.       Objective:    BP 110/90  Pulse 78  Temp(Src) 97.7 F (36.5 C) (Oral)  Wt 174 lb (78.926 kg)  SpO2 97% Physical Exam  Constitutional: She is oriented to person, place, and time. She appears well-developed and well-nourished. No distress.  HENT:  Head: Normocephalic and atraumatic.  Right Ear: External ear normal.  Left Ear: External ear normal.  Nose: Nose normal.  Mouth/Throat: Oropharynx is clear and moist. No oropharyngeal exudate.  Eyes: Conjunctivae are normal. Pupils are equal, round, and reactive to light. Right eye exhibits no  discharge. Left eye exhibits no discharge. No scleral icterus.  Neck: Normal range of motion. Neck supple. No tracheal deviation present. No thyromegaly present.  Cardiovascular: Normal rate, regular rhythm, normal heart sounds and intact distal pulses.  Exam reveals no gallop and no friction rub.   No murmur heard. Pulmonary/Chest: Effort normal. No accessory muscle usage. Not tachypneic. No respiratory distress. She has no decreased breath sounds. She has no wheezes. She has rhonchi (scattered). She has no rales. She exhibits no tenderness.  Musculoskeletal: Normal range of motion. She exhibits no edema and no tenderness.  Lymphadenopathy:    She has no cervical adenopathy.  Neurological: She is alert and oriented to person, place, and time. No cranial nerve deficit. She exhibits normal muscle tone. Coordination normal.  Skin: Skin is warm and dry. No rash noted. She is not diaphoretic. No erythema. No pallor.  Psychiatric: She has a normal mood and affect. Her behavior is normal. Judgment and thought content normal.          Assessment & Plan:   Problem List Items Addressed This Visit   Acute bronchitis - Primary     Symptoms and exam consistent with acute bronchitis. Recommended rest, adequate fluids, use of tylenol or ibuprofen prn. Will start Azithromycin and Tussionex prn cough. Follow up if symptoms are not improving over next few days.    Relevant Medications      chlorpheniramine-HYDROcodone (TUSSIONEX) suspension 8-10 mg/365mL      azithromycin (ZITHROMAX) tablet       Return in about 10 months (around 01/26/2014), or if symptoms worsen or fail to improve, for Physical.

## 2013-03-29 NOTE — Progress Notes (Signed)
Pre visit review using our clinic review tool, if applicable. No additional management support is needed unless otherwise documented below in the visit note. 

## 2013-11-10 ENCOUNTER — Other Ambulatory Visit: Payer: Self-pay | Admitting: Internal Medicine

## 2013-11-12 NOTE — Telephone Encounter (Signed)
Last refill 3.4.15, last CPE 12.1.14.  Please advise refill

## 2013-11-16 ENCOUNTER — Ambulatory Visit (INDEPENDENT_AMBULATORY_CARE_PROVIDER_SITE_OTHER): Payer: BC Managed Care – PPO | Admitting: Internal Medicine

## 2013-11-16 ENCOUNTER — Encounter: Payer: Self-pay | Admitting: Internal Medicine

## 2013-11-16 ENCOUNTER — Other Ambulatory Visit: Payer: Self-pay | Admitting: *Deleted

## 2013-11-16 VITALS — BP 114/80 | HR 65 | Temp 97.6°F | Ht 65.0 in | Wt 179.2 lb

## 2013-11-16 DIAGNOSIS — N920 Excessive and frequent menstruation with regular cycle: Secondary | ICD-10-CM

## 2013-11-16 DIAGNOSIS — R748 Abnormal levels of other serum enzymes: Secondary | ICD-10-CM

## 2013-11-16 DIAGNOSIS — F419 Anxiety disorder, unspecified: Secondary | ICD-10-CM

## 2013-11-16 LAB — COMPREHENSIVE METABOLIC PANEL
ALBUMIN: 3.8 g/dL (ref 3.5–5.2)
ALK PHOS: 61 U/L (ref 39–117)
ALT: 27 U/L (ref 0–35)
AST: 42 U/L — AB (ref 0–37)
BILIRUBIN TOTAL: 0.4 mg/dL (ref 0.2–1.2)
BUN: 13 mg/dL (ref 6–23)
CO2: 26 mEq/L (ref 19–32)
Calcium: 9.7 mg/dL (ref 8.4–10.5)
Chloride: 106 mEq/L (ref 96–112)
Creatinine, Ser: 0.7 mg/dL (ref 0.4–1.2)
GFR: 91.37 mL/min (ref >60.00)
GLUCOSE: 85 mg/dL (ref 70–99)
POTASSIUM: 4.4 meq/L (ref 3.5–5.1)
SODIUM: 138 meq/L (ref 135–145)
TOTAL PROTEIN: 7.9 g/dL (ref 6.0–8.3)

## 2013-11-16 LAB — CBC WITH DIFFERENTIAL/PLATELET
Basophils Absolute: 0 10*3/uL (ref 0.0–0.1)
Basophils Relative: 0.4 % (ref 0.0–3.0)
EOS ABS: 0.2 10*3/uL (ref 0.0–0.7)
EOS PCT: 2.8 % (ref 0.0–5.0)
HEMATOCRIT: 38.8 % (ref 36.0–46.0)
Hemoglobin: 13 g/dL (ref 12.0–15.0)
LYMPHS ABS: 1.6 10*3/uL (ref 0.7–4.0)
Lymphocytes Relative: 29.7 % (ref 12.0–46.0)
MCHC: 33.5 g/dL (ref 30.0–36.0)
MCV: 91.9 fl (ref 78.0–100.0)
MONO ABS: 0.6 10*3/uL (ref 0.1–1.0)
Monocytes Relative: 11.5 % (ref 3.0–12.0)
NEUTROS PCT: 55.6 % (ref 43.0–77.0)
Neutro Abs: 3 10*3/uL (ref 1.4–7.7)
PLATELETS: 252 10*3/uL (ref 150.0–400.0)
RBC: 4.22 Mil/uL (ref 3.87–5.11)
RDW: 12.6 % (ref 11.5–15.5)
WBC: 5.4 10*3/uL (ref 4.0–10.5)

## 2013-11-16 MED ORDER — ALPRAZOLAM 0.5 MG PO TABS: 0.5000 mg | ORAL_TABLET | Freq: Three times a day (TID) | ORAL | Status: DC | PRN

## 2013-11-16 NOTE — Progress Notes (Signed)
    Subjective:    Patient ID: Diane Hahn, female    DOB: 1970-12-15, 43 y.o.   MRN: [id number]  HPI 43YO female presents for follow up.  Needs refill on Xanax. [place]  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  Feeling well.  Having some hot flashes, esp at night. Periods have changed with heavier flow and blood clots. Feels exhausted with this. Unable to work during heavy blood flow.  Review of Systems  Constitutional: Negative for fever, chills, appetite change, fatigue and unexpected weight change.  Eyes: Negative for visual disturbance.  Respiratory: Negative for shortness of breath.   Cardiovascular: Negative for chest pain and leg swelling.  Gastrointestinal: Negative for nausea, vomiting, abdominal pain, diarrhea and constipation.  Endocrine: Positive for heat intolerance (at night).  Genitourinary: Positive for menstrual problem.  Skin: Negative for color change and rash.  Hematological: Negative for adenopathy. Does not bruise/bleed easily.  Psychiatric/Behavioral: Negative for dysphoric mood. The patient is nervous/anxious (during flights).        Objective:    BP 114/80  Pulse 65  Temp(Src) 97.6 F (36.4 C) (Oral)  Ht 5\' 5"  (1.651 m)  Wt 179 lb 4 oz (81.307 kg)  BMI 29.83 kg/m2  SpO2 96% Physical Exam  Constitutional: She is oriented to person, place, and time. She appears well-developed and well-nourished. No distress.  HENT:  Head: Normocephalic and atraumatic.  Right Ear: External ear normal.  Left Ear: External ear normal.  Nose: Nose normal.  Mouth/Throat: Oropharynx is clear and moist. No oropharyngeal exudate.  Eyes: Conjunctivae are normal. Pupils are equal, round, and reactive to light. Right eye exhibits no discharge. Left eye exhibits no discharge. No scleral icterus.  Neck: Normal range of motion. Neck supple. No tracheal deviation present. No thyromegaly present.  Cardiovascular: Normal rate, regular rhythm,  normal heart sounds and intact distal pulses.  Exam reveals no gallop and no friction rub.   No murmur heard. Pulmonary/Chest: Effort normal and breath sounds normal. No accessory muscle usage. Not tachypneic. No respiratory distress. She has no decreased breath sounds. She has no wheezes. She has no rhonchi. She has no rales. She exhibits no tenderness.  Musculoskeletal: Normal range of motion. She exhibits no edema and no tenderness.  Lymphadenopathy:    She has no cervical adenopathy.  Neurological: She is alert and oriented to person, place, and time. No cranial nerve deficit. She exhibits normal muscle tone. Coordination normal.  Skin: Skin is warm and dry. No rash noted. She is not diaphoretic. No erythema. No pallor.  Psychiatric: She has a normal mood and affect. Her behavior is normal. Judgment and thought content normal.          Assessment & Plan:   Problem List Items Addressed This Visit     Unprioritized   Anxiety     Symptoms of anxiety with flying well controlled with prn alprazolam. Will continue.    Relevant Medications      ALPRAZolam (XANAX) tablet   Menorrhagia with regular cycle - Primary     Recent heavy menses with large clots. Will set up evaluation with OB/GYN for possible endometrial ablation. Will check CBC with labs today.    Relevant Orders      Ambulatory referral to Gynecology      CBC w/Diff      Comprehensive metabolic panel       Return in about 3 months (around 02/16/2014) for Physical.

## 2013-11-16 NOTE — Progress Notes (Signed)
Pre visit review using our clinic review tool, if applicable. No additional management support is needed unless otherwise documented below in the visit note. 

## 2013-11-16 NOTE — Patient Instructions (Addendum)
Labs today. We will set up an evaluation with Dr. Guy FrancoSchmerrhorn at [facility]  Follow up in 3 months for physical.

## 2013-11-16 NOTE — Assessment & Plan Note (Signed)
Recent heavy menses with large clots. Will set up evaluation with OB/GYN for possible endometrial ablation. Will check CBC with labs today.

## 2013-11-16 NOTE — Assessment & Plan Note (Signed)
Symptoms of anxiety with flying well controlled with prn alprazolam. Will continue.

## 2013-12-12 ENCOUNTER — Other Ambulatory Visit (INDEPENDENT_AMBULATORY_CARE_PROVIDER_SITE_OTHER): Payer: BC Managed Care – PPO

## 2013-12-12 DIAGNOSIS — R748 Abnormal levels of other serum enzymes: Secondary | ICD-10-CM

## 2013-12-12 LAB — COMPREHENSIVE METABOLIC PANEL
ALBUMIN: 3.5 g/dL (ref 3.5–5.2)
ALT: 20 U/L (ref 0–35)
AST: 27 U/L (ref 0–37)
Alkaline Phosphatase: 61 U/L (ref 39–117)
BILIRUBIN TOTAL: 0.6 mg/dL (ref 0.2–1.2)
BUN: 11 mg/dL (ref 6–23)
CO2: 23 meq/L (ref 19–32)
Calcium: 9.4 mg/dL (ref 8.4–10.5)
Chloride: 107 mEq/L (ref 96–112)
Creatinine, Ser: 0.8 mg/dL (ref 0.4–1.2)
GFR: 82.29 mL/min (ref >60.00)
GLUCOSE: 106 mg/dL — AB (ref 70–99)
POTASSIUM: 4.2 meq/L (ref 3.5–5.1)
SODIUM: 137 meq/L (ref 135–145)
TOTAL PROTEIN: 6.9 g/dL (ref 6.0–8.3)

## 2013-12-13 ENCOUNTER — Encounter: Payer: Self-pay | Admitting: *Deleted

## 2013-12-20 ENCOUNTER — Other Ambulatory Visit: Payer: Self-pay | Admitting: Internal Medicine

## 2014-01-18 ENCOUNTER — Other Ambulatory Visit: Payer: Self-pay | Admitting: Internal Medicine

## 2014-02-08 HISTORY — PX: ENDOMETRIAL ABLATION: SHX621

## 2014-02-28 ENCOUNTER — Other Ambulatory Visit: Payer: Self-pay | Admitting: Internal Medicine

## 2014-03-11 ENCOUNTER — Ambulatory Visit (INDEPENDENT_AMBULATORY_CARE_PROVIDER_SITE_OTHER): Payer: BLUE CROSS/BLUE SHIELD | Admitting: Internal Medicine

## 2014-03-11 ENCOUNTER — Encounter: Payer: Self-pay | Admitting: Internal Medicine

## 2014-03-11 VITALS — BP 142/88 | HR 80 | Temp 97.7°F | Ht 65.0 in | Wt 195.5 lb

## 2014-03-11 DIAGNOSIS — H6691 Otitis media, unspecified, right ear: Secondary | ICD-10-CM

## 2014-03-11 DIAGNOSIS — H6690 Otitis media, unspecified, unspecified ear: Secondary | ICD-10-CM | POA: Insufficient documentation

## 2014-03-11 MED ORDER — AZITHROMYCIN 250 MG PO TABS: ORAL_TABLET | ORAL | Status: DC

## 2014-03-11 NOTE — Assessment & Plan Note (Signed)
Exam most consistent with right OM with likely right frontal and maxillary sinusitis. Will start Azithromycin. Tylenol or Ibuprofen prn pain. Follow up if symptoms are not improving over next 48 hr. We discussed that she may also have a corneal laceration on right lateral eye, but given this is improving, will monitor for now. She will call if any worsening eye redness, pain, drainage, or visual changes.

## 2014-03-11 NOTE — Progress Notes (Signed)
   Subjective:    Patient ID: Diane Hahn, female    DOB: 20-Sep-1970, 44 y.o.   MRN: [id number]  HPI  43YO female presents for acute visit.  Yesterday noted some redness and clear drainage from right eye. During day, developed general malaise with some tender LAD. Notes aching frontal headache. No nasal congestion Seen by ENT last week. Scheduled to start allergy testing. No fever or chills. No visual changes. No photophobia.  Past medical, surgical, family and social history per today's encounter.  Review of Systems  Constitutional: Positive for fatigue. Negative for fever, chills and unexpected weight change.  HENT: Positive for facial swelling. Negative for congestion, ear discharge, ear pain, hearing loss, mouth sores, nosebleeds, postnasal drip, rhinorrhea, sinus pressure, sneezing, sore throat, tinnitus, trouble swallowing and voice change.   Eyes: Positive for discharge, redness and itching. Negative for pain and visual disturbance.  Respiratory: Negative for cough, chest tightness, shortness of breath, wheezing and stridor.   Cardiovascular: Negative for chest pain, palpitations and leg swelling.  Musculoskeletal: Negative for myalgias, arthralgias, neck pain and neck stiffness.  Skin: Negative for color change and rash.  Neurological: Positive for headaches. Negative for dizziness, weakness and light-headedness.  Hematological: Positive for adenopathy.       Objective:    BP 142/88 mmHg  Pulse 80  Temp(Src) 97.7 F (36.5 C) (Oral)  Ht 5\' 5"  (1.651 m)  Wt 195 lb 8 oz (88.678 kg)  BMI 32.53 kg/m2  SpO2 97% Physical Exam  Constitutional: She is oriented to person, place, and time. She appears well-developed and well-nourished. No distress.  HENT:  Head: Normocephalic and atraumatic.  Right Ear: External ear normal. Tympanic membrane is bulging.  Left Ear: Tympanic membrane and external ear normal.  Nose: Nose normal.  Mouth/Throat: Posterior oropharyngeal erythema  (mild) present.  Eyes: Conjunctivae are normal. Pupils are equal, round, and reactive to light. Right eye exhibits no discharge. Left eye exhibits no discharge. No scleral icterus.    Neck: Normal range of motion. Neck supple. No tracheal deviation present. No thyromegaly present.  Pulmonary/Chest: Effort normal. No accessory muscle usage. No tachypnea. She has no decreased breath sounds. She has no rhonchi.  Musculoskeletal: Normal range of motion. She exhibits no edema or tenderness.  Lymphadenopathy:    She has cervical adenopathy.       Left cervical: Superficial cervical adenopathy present.  Neurological: She is alert and oriented to person, place, and time. No cranial nerve deficit. She exhibits normal muscle tone. Coordination normal.  Skin: Skin is warm and dry. No rash noted. She is not diaphoretic. No erythema. No pallor.  Psychiatric: She has a normal mood and affect. Her behavior is normal. Judgment and thought content normal.          Assessment & Plan:   Problem List Items Addressed This Visit      Unprioritized   Otitis media - Primary    Exam most consistent with right OM with likely right frontal and maxillary sinusitis. Will start Azithromycin. Tylenol or Ibuprofen prn pain. Follow up if symptoms are not improving over next 48 hr. We discussed that she may also have a corneal laceration on right lateral eye, but given this is improving, will monitor for now. She will call if any worsening eye redness, pain, drainage, or visual changes.      Relevant Medications   azithromycin (ZITHROMAX) tablet       Return in about 4 weeks (around 04/08/2014) for Physical.

## 2014-03-11 NOTE — Patient Instructions (Signed)
Start Azithromycin.  Follow up if symptoms not improving.

## 2014-03-11 NOTE — Progress Notes (Signed)
Pre visit review using our clinic review tool, if applicable. No additional management support is needed unless otherwise documented below in the visit note. 

## 2014-03-24 ENCOUNTER — Other Ambulatory Visit: Payer: Self-pay | Admitting: Internal Medicine

## 2014-04-15 ENCOUNTER — Ambulatory Visit (INDEPENDENT_AMBULATORY_CARE_PROVIDER_SITE_OTHER): Payer: BLUE CROSS/BLUE SHIELD | Admitting: Internal Medicine

## 2014-04-15 ENCOUNTER — Encounter: Payer: Self-pay | Admitting: Internal Medicine

## 2014-04-15 ENCOUNTER — Other Ambulatory Visit (HOSPITAL_COMMUNITY)
Admission: RE | Admit: 2014-04-15 | Discharge: 2014-04-15 | Disposition: A | Discharge status: Home / Self Care | Payer: BLUE CROSS/BLUE SHIELD | Source: Ambulatory Visit | Attending: Internal Medicine | Admitting: Internal Medicine

## 2014-04-15 VITALS — BP 136/85 | HR 76 | Temp 98.0°F | Resp 12 | Ht 65.0 in | Wt 198.0 lb

## 2014-04-15 DIAGNOSIS — Z0000 Encounter for general adult medical examination without abnormal findings: Secondary | ICD-10-CM

## 2014-04-15 DIAGNOSIS — E669 Obesity, unspecified: Secondary | ICD-10-CM | POA: Insufficient documentation

## 2014-04-15 DIAGNOSIS — Z01419 Encounter for gynecological examination (general) (routine) without abnormal findings: Secondary | ICD-10-CM | POA: Insufficient documentation

## 2014-04-15 DIAGNOSIS — Z1151 Encounter for screening for human papillomavirus (HPV): Secondary | ICD-10-CM | POA: Insufficient documentation

## 2014-04-15 DIAGNOSIS — N920 Excessive and frequent menstruation with regular cycle: Secondary | ICD-10-CM

## 2014-04-15 NOTE — Patient Instructions (Signed)

## 2014-04-15 NOTE — Progress Notes (Signed)
Subjective:    Patient ID: Diane Hahn, female    DOB: [birth year], 44 y.o.   MRN: [id number]  HPI  44YO female presents for annual exam.  Exercising with personal trainer, recently limited. Trying to follow healthy diet.  Feeling bloated frequently, especially with menses. Gaining 7-8 lbs with menses and having heavy menses. Having cramps with ovulation.  Wt Readings from Last 3 Encounters:  04/15/14 198 lb (89.812 kg)  03/11/14 195 lb 8 oz (88.678 kg)  11/16/13 179 lb 4 oz (81.307 kg)    Past medical, surgical, family and social history per today's encounter.  Review of Systems  Constitutional: Negative for fever, chills, appetite change, fatigue and unexpected weight change.  Eyes: Negative for visual disturbance.  Respiratory: Negative for shortness of breath.   Cardiovascular: Negative for chest pain and leg swelling.  Gastrointestinal: Negative for nausea, vomiting, abdominal pain, diarrhea and constipation.  Genitourinary: Positive for menstrual problem.  Musculoskeletal: Negative for myalgias and arthralgias.  Skin: Negative for color change and rash.  Neurological: Positive for light-headedness (occasional with standing quickly). Negative for dizziness.  Hematological: Negative for adenopathy. Does not bruise/bleed easily.  Psychiatric/Behavioral: Negative for suicidal ideas, sleep disturbance and dysphoric mood. The patient is not nervous/anxious.        Objective:    BP 136/85 mmHg  Pulse 76  Temp(Src) 98 F (36.7 C) (Oral)  Resp 12  Ht 5\' 5"  (1.651 m)  Wt 198 lb (89.812 kg)  BMI 32.95 kg/m2  SpO2 96% Physical Exam  Constitutional: She is oriented to person, place, and time. She appears well-developed and well-nourished. No distress.  HENT:  Head: Normocephalic and atraumatic.  Right Ear: External ear normal.  Left Ear: External ear normal.  Nose: Nose normal.  Mouth/Throat: Oropharynx is clear and moist. No oropharyngeal exudate.  Eyes:  Conjunctivae are normal. Pupils are equal, round, and reactive to light. Right eye exhibits no discharge. Left eye exhibits no discharge. No scleral icterus.  Neck: Normal range of motion. Neck supple. No tracheal deviation present. No thyromegaly present.  Cardiovascular: Normal rate, regular rhythm, normal heart sounds and intact distal pulses.  Exam reveals no gallop and no friction rub.   No murmur heard. Pulmonary/Chest: Effort normal and breath sounds normal. No respiratory distress. She has no wheezes. She has no rales. She exhibits no tenderness.  Abdominal: Soft. Bowel sounds are normal. She exhibits no distension and no mass. There is no tenderness. There is no rebound and no guarding.  Genitourinary: Rectum normal, vagina normal and uterus normal. No breast swelling, tenderness, discharge or bleeding. Pelvic exam was performed with patient supine. There is no rash, tenderness or lesion on the right labia. There is no rash, tenderness or lesion on the left labia. Uterus is not enlarged and not tender. Cervix exhibits no motion tenderness, no discharge and no friability. Right adnexum displays no mass, no tenderness and no fullness. Left adnexum displays no mass, no tenderness and no fullness. No erythema or tenderness in the vagina. No vaginal discharge found.  Musculoskeletal: Normal range of motion. She exhibits no edema or tenderness.  Lymphadenopathy:    She has no cervical adenopathy.  Neurological: She is alert and oriented to person, place, and time. No cranial nerve deficit. She exhibits normal muscle tone. Coordination normal.  Skin: Skin is warm and dry. No rash noted. She is not diaphoretic. No erythema. No pallor.  Psychiatric: She has a normal mood and affect. Her behavior is normal. Judgment and thought  content normal.          Assessment & Plan:   Problem List Items Addressed This Visit      Unprioritized   Menorrhagia with regular cycle    Encouraged her to consider  referral to OB for discussion of Novasure given menorrhagia. Will check CBC with labs.      Obesity (BMI 30-39.9)    Wt Readings from Last 3 Encounters:  04/15/14 198 lb (89.812 kg)  03/11/14 195 lb 8 oz (88.678 kg)  11/16/13 179 lb 4 oz (81.307 kg)   Body mass index is 32.95 kg/(m^2). Encouraged healthy diet and exercise with goal of weight loss.      Routine general medical examination at a health care facility - Primary    General medical exam including breast and pelvic exam normal today. PAP pending. Mammogram ordered. Will check labs including CBC, CMP, lipids, A1c, TSH, Vit D. Flu vaccine declined. Encouraged healthy diet and exercise.      Relevant Orders   CBC with Differential/Platelet   Comprehensive metabolic panel   Lipid panel   Microalbumin / creatinine urine ratio   TSH   Vit D  25 hydroxy (rtn osteoporosis monitoring)   Hemoglobin A1c   MM Digital Screening       Return in about 6 months (around 10/16/2014) for Recheck.

## 2014-04-15 NOTE — Progress Notes (Signed)
Pre visit review using our clinic review tool, if applicable. No additional management support is needed unless otherwise documented below in the visit note. 

## 2014-04-15 NOTE — Addendum Note (Signed)
Addended by: Montine CircleMALDONADO, Alayia Meggison D on: 04/15/2014 02:48 PM   Modules accepted: Orders

## 2014-04-15 NOTE — Assessment & Plan Note (Signed)
Wt Readings from Last 3 Encounters:  04/15/14 198 lb (89.812 kg)  03/11/14 195 lb 8 oz (88.678 kg)  11/16/13 179 lb 4 oz (81.307 kg)   Body mass index is 32.95 kg/(m^2). Encouraged healthy diet and exercise with goal of weight loss.

## 2014-04-15 NOTE — Assessment & Plan Note (Signed)
General medical exam including breast and pelvic exam normal today. PAP pending. Mammogram ordered. Will check labs including CBC, CMP, lipids, A1c, TSH, Vit D. Flu vaccine declined. Encouraged healthy diet and exercise.

## 2014-04-15 NOTE — Assessment & Plan Note (Signed)
Encouraged her to consider [facility] for discussion of Novasure given menorrhagia. Will check CBC with labs.

## 2014-04-16 ENCOUNTER — Encounter: Payer: Self-pay | Admitting: *Deleted

## 2014-04-16 LAB — CYTOLOGY - PAP

## 2014-04-18 ENCOUNTER — Other Ambulatory Visit: Payer: BLUE CROSS/BLUE SHIELD

## 2014-05-13 ENCOUNTER — Ambulatory Visit
Admit: 2014-05-13 | Disposition: A | Discharge status: Home / Self Care | Payer: Self-pay | Attending: Internal Medicine | Admitting: Internal Medicine

## 2014-05-14 ENCOUNTER — Telehealth: Payer: Self-pay | Admitting: Internal Medicine

## 2014-05-14 NOTE — Telephone Encounter (Signed)
Mammogram from 4/4 showed asymmetry in the left breast. They requested additional views. Has this been set up?

## 2014-05-15 ENCOUNTER — Ambulatory Visit
Admit: 2014-05-15 | Disposition: A | Discharge status: Home / Self Care | Payer: Self-pay | Attending: Internal Medicine | Admitting: Internal Medicine

## 2014-05-15 LAB — HM MAMMOGRAPHY: HM Mammogram: NEGATIVE

## 2014-06-10 ENCOUNTER — Other Ambulatory Visit: Payer: Self-pay | Admitting: Internal Medicine

## 2014-06-10 NOTE — Telephone Encounter (Signed)
Last OV 04/15/14, ok refill?

## 2014-06-11 ENCOUNTER — Encounter: Payer: Self-pay | Admitting: Internal Medicine

## 2014-06-11 NOTE — Telephone Encounter (Signed)
Rx phoned into pharmacy.

## 2014-07-09 ENCOUNTER — Telehealth: Payer: Self-pay

## 2014-07-09 NOTE — Telephone Encounter (Signed)
The patient called and is hoping to discuss her ob/gyn apt with the cma - she did not specifically state the reason. Pt callback - [phone]

## 2014-07-15 NOTE — Telephone Encounter (Signed)
Left vm for pt to return my call.  

## 2014-07-17 ENCOUNTER — Encounter: Payer: Self-pay | Admitting: Internal Medicine

## 2014-07-17 ENCOUNTER — Ambulatory Visit (INDEPENDENT_AMBULATORY_CARE_PROVIDER_SITE_OTHER): Payer: BLUE CROSS/BLUE SHIELD | Admitting: Internal Medicine

## 2014-07-17 VITALS — BP 146/98 | HR 84 | Temp 97.8°F | Ht 65.0 in | Wt 204.1 lb

## 2014-07-17 DIAGNOSIS — E079 Disorder of thyroid, unspecified: Secondary | ICD-10-CM

## 2014-07-17 DIAGNOSIS — R030 Elevated blood-pressure reading, without diagnosis of hypertension: Secondary | ICD-10-CM | POA: Diagnosis not present

## 2014-07-17 DIAGNOSIS — B354 Tinea corporis: Secondary | ICD-10-CM | POA: Diagnosis not present

## 2014-07-17 DIAGNOSIS — N920 Excessive and frequent menstruation with regular cycle: Secondary | ICD-10-CM | POA: Diagnosis not present

## 2014-07-17 DIAGNOSIS — I10 Essential (primary) hypertension: Secondary | ICD-10-CM | POA: Insufficient documentation

## 2014-07-17 DIAGNOSIS — IMO0001 Reserved for inherently not codable concepts without codable children: Secondary | ICD-10-CM

## 2014-07-17 MED ORDER — CLOTRIMAZOLE 1 % EX CREA: 1.0000 "application " | TOPICAL_CREAM | Freq: Two times a day (BID) | CUTANEOUS | Status: DC

## 2014-07-17 NOTE — Progress Notes (Signed)
Pre visit review using our clinic review tool, if applicable. No additional management support is needed unless otherwise documented below in the visit note. 

## 2014-07-17 NOTE — Progress Notes (Signed)
Subjective:    Patient ID: Diane Hahn, female    DOB: [birth year], 44 y.o.   MRN: [id number]  HPI  43YO female presents for acute visit.  BP has been more elevated than previously. Feels lightheaded at times. No CP or palpitations. Has also gained some weight. Trying to follow a healthy diet. Exercising. Missed one period recently. Discussed with GYN option of uterine ablation. Planning to proceed with this.   Noticed a spot under arm in April. Not itchy. Slightly red. Not treating with anything    Wt Readings from Last 3 Encounters:  07/17/14 204 lb 2 oz (92.59 kg)  04/15/14 198 lb (89.812 kg)  03/11/14 195 lb 8 oz (88.678 kg)     BP Readings from Last 3 Encounters:  07/17/14 146/98  04/15/14 136/85  03/11/14 142/88     Past medical, surgical, family and social history per today's encounter.  Review of Systems  Constitutional: Positive for fatigue. Negative for fever, chills, appetite change and unexpected weight change.  Eyes: Negative for visual disturbance.  Respiratory: Negative for shortness of breath.   Cardiovascular: Negative for chest pain and leg swelling.  Gastrointestinal: Negative for nausea, vomiting, abdominal pain, diarrhea and constipation.  Musculoskeletal: Negative for myalgias and arthralgias.  Skin: Positive for color change. Negative for rash.  Hematological: Negative for adenopathy. Does not bruise/bleed easily.  Psychiatric/Behavioral: Negative for suicidal ideas, sleep disturbance and dysphoric mood. The patient is not nervous/anxious.        Objective:    BP 146/98 mmHg  Pulse 84  Temp(Src) 97.8 F (36.6 C) (Oral)  Ht 5\' 5"  (1.651 m)  Wt 204 lb 2 oz (92.59 kg)  BMI 33.97 kg/m2  SpO2 95% Physical Exam  Constitutional: She is oriented to person, place, and time. She appears well-developed and well-nourished. No distress.  HENT:  Head: Normocephalic and atraumatic.  Right Ear: External ear normal.  Left Ear: External ear  normal.  Nose: Nose normal.  Mouth/Throat: Oropharynx is clear and moist. No oropharyngeal exudate.  Eyes: Conjunctivae are normal. Pupils are equal, round, and reactive to light. Right eye exhibits no discharge. Left eye exhibits no discharge. No scleral icterus.  Neck: Normal range of motion. Neck supple. No tracheal deviation present. No thyromegaly present.  Cardiovascular: Normal rate, regular rhythm, normal heart sounds and intact distal pulses.  Exam reveals no gallop and no friction rub.   No murmur heard. Pulmonary/Chest: Effort normal and breath sounds normal. No respiratory distress. She has no wheezes. She has no rales. She exhibits no tenderness.  Musculoskeletal: Normal range of motion. She exhibits no edema or tenderness.  Lymphadenopathy:    She has no cervical adenopathy.  Neurological: She is alert and oriented to person, place, and time. No cranial nerve deficit. She exhibits normal muscle tone. Coordination normal.  Skin: Skin is warm and dry. No rash noted. She is not diaphoretic. No erythema. No pallor.     Psychiatric: She has a normal mood and affect. Her behavior is normal. Judgment and thought content normal.          Assessment & Plan:   Problem List Items Addressed This Visit      Unprioritized   Elevated BP    BP Readings from Last 3 Encounters:  07/17/14 146/98  04/15/14 136/85  03/11/14 142/88   BP elevated. Discussed starting medications versus focusing on weight loss and treatment of thyroid dysfunction first. She prefers to hold off on medication for now. Plan monitor BP  at home and email or call with readings. Follow up in 4 weeks.      Menorrhagia with regular cycle    Planning for endometrial ablation with Dr. Guy Franco.      Ringworm of body - Primary    Exam c/w tinea. Will start topical Lotrimin. Follow up prn.      Relevant Medications   clotrimazole (LOTRIMIN) 1 % cream   Thyroid dysfunction    TSH upper limit of normal and T4  low. Reviewed notes from endocrinology. Suspect early hypothyroidism. Will follow up after endocrine evaluation.          Return in about 4 weeks (around 08/14/2014) for Recheck of Blood Pressure.

## 2014-07-17 NOTE — Assessment & Plan Note (Signed)
BP Readings from Last 3 Encounters:  07/17/14 146/98  04/15/14 136/85  03/11/14 142/88   BP elevated. Discussed starting medications versus focusing on weight loss and treatment of thyroid dysfunction first. She prefers to hold off on medication for now. Plan monitor BP at home and email or call with readings. Follow up in 4 weeks.

## 2014-07-17 NOTE — Assessment & Plan Note (Signed)
Exam c/w tinea. Will start topical Lotrimin. Follow up prn.

## 2014-07-17 NOTE — Assessment & Plan Note (Signed)
Planning for endometrial ablation with Dr. Guy FrancoSchmerrhorn.

## 2014-07-17 NOTE — Assessment & Plan Note (Signed)
TSH upper limit of normal and T4 low. Reviewed notes from endocrinology. Suspect early hypothyroidism. Will follow up after endocrine evaluation.

## 2014-07-17 NOTE — Patient Instructions (Signed)
Check blood pressure at home and call with readings once per week.  Follow up in 4 weeks.

## 2014-08-27 ENCOUNTER — Encounter: Payer: Self-pay | Admitting: *Deleted

## 2014-08-27 ENCOUNTER — Encounter: Payer: Self-pay | Admitting: Internal Medicine

## 2014-08-27 ENCOUNTER — Ambulatory Visit (INDEPENDENT_AMBULATORY_CARE_PROVIDER_SITE_OTHER): Payer: BLUE CROSS/BLUE SHIELD | Admitting: Internal Medicine

## 2014-08-27 VITALS — BP 146/94 | HR 97 | Temp 97.6°F | Ht 65.0 in | Wt 203.4 lb

## 2014-08-27 DIAGNOSIS — R030 Elevated blood-pressure reading, without diagnosis of hypertension: Secondary | ICD-10-CM | POA: Diagnosis not present

## 2014-08-27 DIAGNOSIS — R5382 Chronic fatigue, unspecified: Secondary | ICD-10-CM

## 2014-08-27 DIAGNOSIS — IMO0001 Reserved for inherently not codable concepts without codable children: Secondary | ICD-10-CM

## 2014-08-27 DIAGNOSIS — N920 Excessive and frequent menstruation with regular cycle: Secondary | ICD-10-CM

## 2014-08-27 LAB — CBC WITH DIFFERENTIAL/PLATELET
BASOS ABS: 0 10*3/uL (ref 0.0–0.1)
Basophils Relative: 0.5 % (ref 0.0–3.0)
EOS ABS: 0.2 10*3/uL (ref 0.0–0.7)
EOS PCT: 3.5 % (ref 0.0–5.0)
HCT: 39.5 % (ref 36.0–46.0)
HEMOGLOBIN: 13.3 g/dL (ref 12.0–15.0)
LYMPHS PCT: 37.1 % (ref 12.0–46.0)
Lymphs Abs: 1.6 10*3/uL (ref 0.7–4.0)
MCHC: 33.7 g/dL (ref 30.0–36.0)
MCV: 93.2 fl (ref 78.0–100.0)
MONO ABS: 0.4 10*3/uL (ref 0.1–1.0)
MONOS PCT: 8.6 % (ref 3.0–12.0)
NEUTROS ABS: 2.2 10*3/uL (ref 1.4–7.7)
Neutrophils Relative %: 50.3 % (ref 43.0–77.0)
Platelets: 239 10*3/uL (ref 150.0–400.0)
RBC: 4.24 Mil/uL (ref 3.87–5.11)
RDW: 13.8 % (ref 11.5–15.5)
WBC: 4.4 10*3/uL (ref 4.0–10.5)

## 2014-08-27 LAB — FERRITIN: Ferritin: 92.9 ng/mL (ref 10.0–291.0)

## 2014-08-27 MED ORDER — AMLODIPINE BESYLATE 2.5 MG PO TABS: 2.5000 mg | ORAL_TABLET | Freq: Every day | ORAL | Status: DC

## 2014-08-27 NOTE — Assessment & Plan Note (Signed)
BP Readings from Last 3 Encounters:  08/27/14 146/94  07/17/14 146/98  04/15/14 136/85   BP recently elevated. Will start Amlodipine 2.5mg  daily. Recheck BP in 4 weeks. Call sooner with any concerns.

## 2014-08-27 NOTE — Progress Notes (Signed)
Subjective:    Patient ID: Diane Hahn, female    DOB: 01/02/1971, 44 y.o.   MRN: [id number]  HPI  44YO female presents for follow up.  Last seen 6/8 for ringworm. BP noted to be elevated at that visit.  BP Readings from Last 3 Encounters:  08/27/14 146/94  07/17/14 146/98  04/15/14 136/85   Feeling tired. Recently had very heavy menses.  Menorrhagia - Soaking super tampon every 30-2160min during menses. Large blood clots. Planning to have endometrial ablation with Dr. Guy FrancoSchmerrhorn.  Past medical, surgical, family and social history per today's encounter.  Review of Systems  Constitutional: Positive for fatigue. Negative for fever, chills, appetite change and unexpected weight change.  Eyes: Negative for visual disturbance.  Respiratory: Negative for shortness of breath.   Cardiovascular: Negative for chest pain and leg swelling.  Gastrointestinal: Negative for nausea, vomiting, abdominal pain, diarrhea and constipation.  Genitourinary: Positive for menstrual problem. Negative for urgency, frequency, vaginal pain and pelvic pain.  Musculoskeletal: Negative for myalgias and arthralgias.  Skin: Negative for color change and rash.  Hematological: Negative for adenopathy. Does not bruise/bleed easily.  Psychiatric/Behavioral: Negative for sleep disturbance and dysphoric mood. The patient is not nervous/anxious.        Objective:    BP 146/94 mmHg  Pulse 97  Temp(Src) 97.6 F (36.4 C) (Oral)  Ht 5\' 5"  (1.651 m)  Wt 203 lb 6 oz (92.25 kg)  BMI 33.84 kg/m2  SpO2 98% Physical Exam  Constitutional: She is oriented to person, place, and time. She appears well-developed and well-nourished. No distress.  HENT:  Head: Normocephalic and atraumatic.  Right Ear: External ear normal.  Left Ear: External ear normal.  Nose: Nose normal.  Mouth/Throat: Oropharynx is clear and moist. No oropharyngeal exudate.  Eyes: Conjunctivae are normal. Pupils are equal, round, and reactive to  light. Right eye exhibits no discharge. Left eye exhibits no discharge. No scleral icterus.  Neck: Normal range of motion. Neck supple. No tracheal deviation present. No thyromegaly present.  Cardiovascular: Normal rate, regular rhythm, normal heart sounds and intact distal pulses.  Exam reveals no gallop and no friction rub.   No murmur heard. Pulmonary/Chest: Effort normal and breath sounds normal. No respiratory distress. She has no wheezes. She has no rales. She exhibits no tenderness.  Musculoskeletal: Normal range of motion. She exhibits no edema or tenderness.  Lymphadenopathy:    She has no cervical adenopathy.  Neurological: She is alert and oriented to person, place, and time. No cranial nerve deficit. She exhibits normal muscle tone. Coordination normal.  Skin: Skin is warm and dry. No rash noted. She is not diaphoretic. No erythema. No pallor.  Psychiatric: She has a normal mood and affect. Her behavior is normal. Judgment and thought content normal.          Assessment & Plan:   Problem List Items Addressed This Visit      Unprioritized   Chronic fatigue    Chronic fatigue likely multifactorial with thyroid dysfunction and menorrhagia. Will check CBC today. Scheduled to have follow up for thyroid dysfunction in 3 weeks with endocrinology.      Relevant Orders   CBC with Differential/Platelet   Ferritin   Elevated BP - Primary    BP Readings from Last 3 Encounters:  08/27/14 146/94  07/17/14 146/98  04/15/14 136/85   BP recently elevated. Will start Amlodipine 2.5mg  daily. Recheck BP in 4 weeks. Call sooner with any concerns.  Menorrhagia with regular cycle    Persistent menorrhagia. Will check CBC and ferritin with labs today. Follow up with GYN as scheduled for possible endometrial ablation.          Return in about 4 weeks (around 09/24/2014) for Recheck of Blood Pressure.

## 2014-08-27 NOTE — Patient Instructions (Signed)
Start Amlodipine 2.5mg  daily to help with blood pressure.  Labs today.  Follow up with GYN as scheduled.

## 2014-08-27 NOTE — Assessment & Plan Note (Signed)
Persistent menorrhagia. Will check CBC and ferritin with labs today. Follow up with GYN as scheduled for possible endometrial ablation.

## 2014-08-27 NOTE — Progress Notes (Signed)
Pre visit review using our clinic review tool, if applicable. No additional management support is needed unless otherwise documented below in the visit note. 

## 2014-08-27 NOTE — Assessment & Plan Note (Signed)
Chronic fatigue likely multifactorial with thyroid dysfunction and menorrhagia. Will check CBC today. Scheduled to have follow up for thyroid dysfunction in 3 weeks with endocrinology.

## 2014-08-28 ENCOUNTER — Encounter: Payer: Self-pay | Admitting: *Deleted

## 2014-09-02 ENCOUNTER — Other Ambulatory Visit: Payer: Self-pay | Admitting: Internal Medicine

## 2014-09-30 ENCOUNTER — Telehealth: Payer: Self-pay | Admitting: *Deleted

## 2014-09-30 NOTE — Telephone Encounter (Signed)
Pt called states her Endocrinologist suggests her Amlodipine 2.5 mg be increased.  Right now her BP is ranging approx. 132/100 consistently.  Pts last OV 7.19.16.   Please advise Rx increase

## 2014-09-30 NOTE — Telephone Encounter (Signed)
I read the note from her endocrinologist. She recommended a follow up appointment here with [place]. Please schedule a follow up visit.

## 2014-10-16 ENCOUNTER — Ambulatory Visit (INDEPENDENT_AMBULATORY_CARE_PROVIDER_SITE_OTHER): Payer: BLUE CROSS/BLUE SHIELD | Admitting: Internal Medicine

## 2014-10-16 ENCOUNTER — Encounter: Payer: Self-pay | Admitting: Internal Medicine

## 2014-10-16 ENCOUNTER — Encounter (INDEPENDENT_AMBULATORY_CARE_PROVIDER_SITE_OTHER): Payer: Self-pay

## 2014-10-16 VITALS — BP 133/92 | HR 88 | Temp 98.1°F | Ht 65.0 in | Wt 209.0 lb

## 2014-10-16 DIAGNOSIS — I10 Essential (primary) hypertension: Secondary | ICD-10-CM

## 2014-10-16 DIAGNOSIS — E079 Disorder of thyroid, unspecified: Secondary | ICD-10-CM | POA: Diagnosis not present

## 2014-10-16 DIAGNOSIS — N920 Excessive and frequent menstruation with regular cycle: Secondary | ICD-10-CM | POA: Diagnosis not present

## 2014-10-16 DIAGNOSIS — E669 Obesity, unspecified: Secondary | ICD-10-CM | POA: Diagnosis not present

## 2014-10-16 DIAGNOSIS — R5382 Chronic fatigue, unspecified: Secondary | ICD-10-CM

## 2014-10-16 MED ORDER — AMLODIPINE BESYLATE 5 MG PO TABS: 5.0000 mg | ORAL_TABLET | Freq: Every day | ORAL | Status: DC

## 2014-10-16 MED ORDER — ESCITALOPRAM OXALATE 20 MG PO TABS: 20.0000 mg | ORAL_TABLET | Freq: Every day | ORAL | Status: DC

## 2014-10-16 NOTE — Progress Notes (Signed)
Subjective:    Patient ID: Diane Hahn, female    DOB: [birth year], 44 y.o.   MRN: [id number]  HPI  44YO female presents for follow up.  Completed endometrial ablation 2 weeks ago. No bleeding. No pain. Some clear vaginal drainage postoperatively.  Hypothyroidism - TSH recently elevated 6.8 at endocrinology. T4 low at 0.6. Dose of Levothyroxine increased. Has follow up labs and visit scheduled.  HTN - BP mostly 130/90. No CP, HA. Compliant with amlodipine 2.5mg  daily.  Exercising and limiting caloric intake. Frustrated by lack of weight loss.  BP Readings from Last 3 Encounters:  10/16/14 133/92  08/27/14 146/94  07/17/14 146/98   Wt Readings from Last 3 Encounters:  10/16/14 209 lb (94.802 kg)  08/27/14 203 lb 6 oz (92.25 kg)  07/17/14 204 lb 2 oz (92.59 kg)     Past Medical History  Diagnosis Date  . Panic attack   . HLD (hyperlipidemia)   . Positive TB test    Family History  Problem Relation Age of Onset  . Coronary artery disease      family hx; and of valvular disease  . Sudden death      family hx  . Diabetes      family hx  . Hyperlipidemia      family hx  . Hypertension      family hx  . Cancer      family hx  . Thyroid disease      family hx   . Heart disease Mother 83    s/p stent  . Hyperlipidemia Mother   . Heart disease Father    Past Surgical History  Procedure Laterality Date  . Ovary and cyst removal    . Tonsillectomy    . Breast reduction surgery    . Foot surgery    . Cesarean section  2009, 2012  . Dilation and curettage of uterus  2006  . Breast biopsy  2005  . Tubal ligation  05/25/10  . Endometrial ablation  2016   Social History   Social History  . Marital Status: Married    Spouse Name: N/A  . Number of Children: N/A  . Years of Education: N/A   Social History Main Topics  . Smoking status: Former Games developer  . Smokeless tobacco: None     Comment: quit in 2008  . Alcohol Use: Yes     Comment: 1-2 glasses of wine  with dinner   . Drug Use: No  . Sexual Activity: Not Asked   Other Topics Concern  . 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     Review of Systems  Constitutional: Positive for fatigue. Negative for fever, chills, appetite change and unexpected weight change.  Eyes: Negative for visual disturbance.  Respiratory: Negative for shortness of breath.   Cardiovascular: Negative for chest pain and leg swelling.  Gastrointestinal: Negative for nausea, vomiting, abdominal pain, diarrhea and constipation.  Genitourinary: Positive for vaginal discharge. Negative for vaginal bleeding and vaginal pain.  Musculoskeletal: Negative for myalgias and arthralgias.  Skin: Negative for color change and rash.  Hematological: Negative for adenopathy. Does not bruise/bleed easily.  Psychiatric/Behavioral: Negative for dysphoric mood. The patient is not nervous/anxious.        Objective:    BP 133/92 mmHg  Pulse 88  Temp(Src) 98.1 F (36.7 C) (Oral)  Ht 5\' 5"  (1.651 m)  Wt 209 lb (94.802 kg)  BMI 34.78 kg/m2  SpO2  97% Physical Exam  Constitutional: She is oriented to person, place, and time. She appears well-developed and well-nourished. No distress.  HENT:  Head: Normocephalic and atraumatic.  Right Ear: External ear normal.  Left Ear: External ear normal.  Nose: Nose normal.  Mouth/Throat: Oropharynx is clear and moist. No oropharyngeal exudate.  Eyes: Conjunctivae are normal. Pupils are equal, round, and reactive to light. Right eye exhibits no discharge. Left eye exhibits no discharge. No scleral icterus.  Neck: Normal range of motion. Neck supple. No tracheal deviation present. No thyromegaly present.  Cardiovascular: Normal rate, regular rhythm, normal heart sounds and intact distal pulses.  Exam reveals no gallop and no friction rub.   No murmur heard. Pulmonary/Chest: Effort normal and breath sounds normal. No respiratory distress. She has no wheezes. She has  no rales. She exhibits no tenderness.  Musculoskeletal: Normal range of motion. She exhibits no edema or tenderness.  Lymphadenopathy:    She has no cervical adenopathy.  Neurological: She is alert and oriented to person, place, and time. No cranial nerve deficit. She exhibits normal muscle tone. Coordination normal.  Skin: Skin is warm and dry. No rash noted. She is not diaphoretic. No erythema. No pallor.  Psychiatric: She has a normal mood and affect. Her behavior is normal. Judgment and thought content normal.          Assessment & Plan:   Problem List Items Addressed This Visit      Unprioritized   Chronic fatigue    Likely multifactorial with low thyroid function, menorrhagia. Will monitor for now while dose of levothyroxine adjusted. Consider sleep study if symptoms persistent.      Hypertension - Primary    BP Readings from Last 3 Encounters:  10/16/14 133/92  08/27/14 146/94  07/17/14 146/98   BP elevated. Will increase Amlodipine to  daily. Recheck BP at home and at endocrinology visit.      Relevant Medications   amLODipine (NORVASC) 5 MG tablet   Menorrhagia with regular cycle    S/p endometrial ablation. Follow up with GYN pending.      Obesity (BMI 30-39.9)    Wt Readings from Last 3 Encounters:  10/16/14 209 lb (94.802 kg)  08/27/14 203 lb 6 oz (92.25 kg)  07/17/14 204 lb 2 oz (92.59 kg)   Body mass index is 34.78 kg/(m^2). Encouraged healthy diet and exercise.      Thyroid dysfunction    Recent TSH elevated at 6.8. Levothyroxine dose increased. Repeat labs and endocrine visit pending for 11/2014.      Relevant Medications   levothyroxine (SYNTHROID, LEVOTHROID) 50 MCG tablet       Return in about 6 months (around 04/15/2015) for Recheck.

## 2014-10-16 NOTE — Assessment & Plan Note (Signed)
S/p endometrial ablation. Follow up with GYN pending.

## 2014-10-16 NOTE — Assessment & Plan Note (Signed)
Likely multifactorial with low thyroid function, menorrhagia. Will monitor for now while dose of levothyroxine adjusted. Consider sleep study if symptoms persistent.

## 2014-10-16 NOTE — Assessment & Plan Note (Signed)
Wt Readings from Last 3 Encounters:  10/16/14 209 lb (94.802 kg)  08/27/14 203 lb 6 oz (92.25 kg)  07/17/14 204 lb 2 oz (92.59 kg)   Body mass index is 34.78 kg/(m^2). Encouraged healthy diet and exercise.

## 2014-10-16 NOTE — Patient Instructions (Addendum)
Increase Amlodipine to  daily.  Follow up in 6 months or sooner as needed.

## 2014-10-16 NOTE — Assessment & Plan Note (Signed)
BP Readings from Last 3 Encounters:  10/16/14 133/92  08/27/14 146/94  07/17/14 146/98   BP elevated. Will increase Amlodipine to  daily. Recheck BP at home and at endocrinology visit.

## 2014-10-16 NOTE — Assessment & Plan Note (Signed)
Recent TSH elevated at 6.8. Levothyroxine dose increased. Repeat labs and endocrine visit pending for 11/2014.

## 2014-10-16 NOTE — Progress Notes (Signed)
Pre visit review using our clinic review tool, if applicable. No additional management support is needed unless otherwise documented below in the visit note. 

## 2014-11-01 ENCOUNTER — Other Ambulatory Visit: Payer: Self-pay | Admitting: Internal Medicine

## 2015-01-10 ENCOUNTER — Emergency Department
Admission: EM | Admit: 2015-01-10 | Discharge: 2015-01-10 | Disposition: A | Discharge status: Home / Self Care | Payer: BLUE CROSS/BLUE SHIELD | Attending: Emergency Medicine | Admitting: Emergency Medicine

## 2015-01-10 ENCOUNTER — Encounter: Payer: Self-pay | Admitting: *Deleted

## 2015-01-10 DIAGNOSIS — M542 Cervicalgia: Secondary | ICD-10-CM | POA: Insufficient documentation

## 2015-01-10 DIAGNOSIS — Z87891 Personal history of nicotine dependence: Secondary | ICD-10-CM | POA: Insufficient documentation

## 2015-01-10 DIAGNOSIS — Z79899 Other long term (current) drug therapy: Secondary | ICD-10-CM | POA: Insufficient documentation

## 2015-01-10 DIAGNOSIS — Z880 Allergy status to penicillin: Secondary | ICD-10-CM | POA: Diagnosis not present

## 2015-01-10 DIAGNOSIS — I10 Essential (primary) hypertension: Secondary | ICD-10-CM | POA: Diagnosis not present

## 2015-01-10 DIAGNOSIS — E669 Obesity, unspecified: Secondary | ICD-10-CM | POA: Diagnosis not present

## 2015-01-10 DIAGNOSIS — R531 Weakness: Secondary | ICD-10-CM | POA: Diagnosis not present

## 2015-01-10 DIAGNOSIS — R112 Nausea with vomiting, unspecified: Secondary | ICD-10-CM | POA: Diagnosis not present

## 2015-01-10 DIAGNOSIS — R51 Headache: Secondary | ICD-10-CM | POA: Diagnosis present

## 2015-01-10 MED ORDER — IBUPROFEN 800 MG PO TABS: 800.0000 mg | ORAL_TABLET | Freq: Three times a day (TID) | ORAL | Status: DC | PRN

## 2015-01-10 MED ORDER — ONDANSETRON HCL 4 MG PO TABS: 4.0000 mg | ORAL_TABLET | Freq: Every day | ORAL | Status: DC | PRN

## 2015-01-10 NOTE — ED Provider Notes (Signed)
[facility] Emergency Department Provider Note  ____________________________________________  Time seen: Approximately 2:58 PM  I have reviewed the triage vital signs and the nursing notes.   HISTORY  Chief Complaint Headache    HPI Diane Hahn is a 44 y.o. female presenting with generalized weakness, neck pain, and one episode of vomiting. Patient reports that yesterday she was in her usual state of health and had not had any trauma or strain when she developed some mild neck pain "like I slept wrong." This was associated with some generalized weakness. This morning she had breakfast and had a single episode of vomiting and afterwards her neck pain became worse and radiated up to the posterior scalp. She does not have any associated numbness, tingling, weakness, fever, chills, tick bites, rash. No visual changes. She took 2 ibuprofen at home and now her neck pain is almost completely gone. Her nausea resolved after the vomiting episode. She initially presented to the [facility] clinic who referred her here.   Past Medical History  Diagnosis Date  . Panic attack   . HLD (hyperlipidemia)   . Positive TB test     Patient Active Problem List   Diagnosis Date Noted  . Chronic fatigue 08/27/2014  . Thyroid dysfunction 07/17/2014  . Hypertension 07/17/2014  . Obesity (BMI 30-39.9) 04/15/2014  . Menorrhagia with regular cycle 11/16/2013  . Routine general medical examination at a health care facility 01/08/2013  . Allergic rhinitis 12/10/2011  . Anxiety 10/29/2011  . Hyperlipidemia 02/04/2011  . ABNORMAL EKG 07/11/2009    Past Surgical History  Procedure Laterality Date  . Ovary and cyst removal    . Tonsillectomy    . Breast reduction surgery    . Foot surgery    . Cesarean section  2009, 2012  . Dilation and curettage of uterus  2006  . Breast biopsy  2005  . Tubal ligation  05/25/10  . Endometrial ablation  2016    Current Outpatient Rx  Name   Route  Sig  Dispense  Refill  . ALPRAZolam (XANAX) 0.5 MG tablet      TAKE 1 TABLET BY MOUTH 3 TIMES DAILY AS NEEDED FOR ANXIETY OR SLEEP   60 tablet   3     This request is for a new prescription for a contr ...   . amLODipine (NORVASC) 5 MG tablet   Oral   Take 1 tablet (5 mg total) by mouth daily.   90 tablet   3   . desloratadine (CLARINEX) 5 MG tablet      TAKE 1 TABLET BY MOUTH AS NEEDED.   90 tablet   1   . EPINEPHrine (EPIPEN 2-PAK) 0.3 mg/0.3 mL SOAJ injection      Use as directed   2 Device   3   . escitalopram (LEXAPRO) 20 MG tablet   Oral   Take 1 tablet (20 mg total) by mouth daily.   90 tablet   3   . ibuprofen (ADVIL,MOTRIN) 800 MG tablet   Oral   Take 1 tablet (800 mg total) by mouth every 8 (eight) hours as needed for moderate pain (with food).   20 tablet   0   . levothyroxine (SYNTHROID, LEVOTHROID) 50 MCG tablet   Oral   Take by mouth.         . ondansetron (ZOFRAN) 4 MG tablet   Oral   Take 1 tablet (4 mg total) by mouth daily as needed for nausea or  vomiting.   12 tablet   0   . rosuvastatin (CRESTOR) 5 MG tablet      TAKE 1 TABLET BY MOUTH AT BEDTIME   30 tablet   11     Allergies Cephalexin and Penicillins  Family History  Problem Relation Age of Onset  . Coronary artery disease      family hx; and of valvular disease  . Sudden death      family hx  . Diabetes      family hx  . Hyperlipidemia      family hx  . Hypertension      family hx  . Cancer      family hx  . Thyroid disease      family hx   . Heart disease Mother [id number]    s/p stent  . Hyperlipidemia Mother   . Heart disease Father     Social History Social History  Substance Use Topics  . Smoking status: Former Games developermoker  . Smokeless tobacco: None     Comment: quit in 2008  . Alcohol Use: Yes     Comment: 1-2 glasses of wine with dinner     Review of Systems Constitutional: No fever/chills. No lightheadedness or syncope. No trauma. Eyes: No visual  changes. No double or blurred vision. ENT: No sore throat. No rhinorrhea or congestion. No recent cough. Cardiovascular: Denies chest pain, palpitations. Respiratory: Denies shortness of breath.  No cough. Gastrointestinal: No abdominal pain.  Positive nausea, positive vomiting.  No diarrhea.  No constipation. Genitourinary: Negative for dysuria. Musculoskeletal: Negative for back pain. Skin: Negative for rash. Neurological: Positive for neck pain that radiated to the head. No numbness tingling or weakness.   10-point ROS otherwise negative.  ____________________________________________   PHYSICAL EXAM:  VITAL SIGNS: ED Triage Vitals  Enc Vitals Group     BP 01/10/15 1214 156/95 mmHg     Pulse Rate 01/10/15 1214 78     Resp 01/10/15 1214 19     Temp 01/10/15 1214 98 F (36.7 C)     Temp Source 01/10/15 1214 Oral     SpO2 01/10/15 1214 98 %     Weight 01/10/15 1214 195 lb (88.451 kg)     Height 01/10/15 1214 5\' 6"  (1.676 m)     Head Cir --      Peak Flow --      Pain Score 01/10/15 1228 4     Pain Loc --      Pain Edu? --      Excl. in GC? --     Constitutional: Alert and oriented. Well appearing and in no acute distress. Answer question appropriately. Eyes: Conjunctivae are normal.  EOMI. EARS: TMs are clear without erythema, bulge or fluid. Canals are clear as well. Head: Atraumatic. Nose: No congestion/rhinnorhea. Mouth/Throat: Mucous membranes are moist. Normal posterior pharynx without erythema. No tonsillar swelling or exudate. Uvula is midline and palate is symmetric. Neck: No stridor.  Supple.  No midline C-spine tenderness to palpation. No step-offs or deformities. No pain with palpation of the midline or lateral aspects of the neck. Patient has full flexion and extension as well as lateral movements without any pain. No meningismus. Cardiovascular: Normal rate, regular rhythm. No murmurs, rubs or gallops.  Respiratory: Normal respiratory effort.  No retractions.  Lungs CTAB.  No wheezes, rales or ronchi. Gastrointestinal: Obese. Soft and nontender. No distention. No peritoneal signs. Musculoskeletal: No LE edema.  Neurologic: Alert and oriented 3. Face and smile  are symmetric. Extra movements are intact and PERRLA. Moves all extremities well. Gait without ataxia.  Skin:  Skin is warm, dry and intact. No rash noted. Psychiatric: Mood and affect are normal. Speech and behavior are normal.  Normal judgement.  ____________________________________________   LABS (all labs ordered are listed, but only abnormal results are displayed)  Labs Reviewed - No data to display ____________________________________________  EKG  Not indicated   ____________________________________________  RADIOLOGY  No results found.  ____________________________________________   PROCEDURES  Procedure(s) performed: None  Critical Care performed: No ____________________________________________   INITIAL IMPRESSION / ASSESSMENT AND PLAN / ED COURSE  Pertinent labs & imaging results that were available during my care of the patient were reviewed by me and considered in my medical decision making (see chart for details).  44 y.o. female with neck pain that radiates to the posterior scalp and a single episode of vomiting which has now resolved. The patient states that she is significantly better at this time, her nausea has completely resolved, she has been tolerating water the entirety of the day, and her neck pain is almost completely gone. On my exam she has no sign of acute injury or infection. I will plan to discharge her home with return precautions as well as close follow-up instructions. ____________________________________________  FINAL CLINICAL IMPRESSION(S) / ED DIAGNOSES  Final diagnoses:  Neck pain  Non-intractable vomiting with nausea, vomiting of unspecified type  Weakness      NEW MEDICATIONS STARTED DURING THIS VISIT:  New Prescriptions    IBUPROFEN (ADVIL,MOTRIN) 800 MG TABLET    Take 1 tablet (800 mg total) by mouth every 8 (eight) hours as needed for moderate pain (with food).   ONDANSETRON (ZOFRAN) 4 MG TABLET    Take 1 tablet (4 mg total) by mouth daily as needed for nausea or vomiting.     Rockne Menghini, MD 01/10/15 [phone]

## 2015-01-10 NOTE — Discharge Instructions (Signed)
Please drink plenty of fluid to stay well hydrated.  Eat small regular meals throughout the day.  For your neck, you may continue to take Ibuprofen and you can also apply a heating pad for 20 minutes every 2-3 hours to help with pain.  Please return to the emergency department if you develop worsening neck pain, numbness tickling or weakness, inability to keep down fluids, abdominal pain, or any other symptoms concerning to you.

## 2015-01-10 NOTE — ED Notes (Signed)
Yesterday developed left neck pain, today vomited and developed headache

## 2015-02-04 ENCOUNTER — Other Ambulatory Visit: Payer: Self-pay | Admitting: Family Medicine

## 2015-02-04 ENCOUNTER — Telehealth: Payer: Self-pay | Admitting: Surgical

## 2015-02-04 MED ORDER — CLOTRIMAZOLE 1 % EX CREA: 1.0000 "application " | TOPICAL_CREAM | Freq: Two times a day (BID) | CUTANEOUS | Status: DC

## 2015-02-04 NOTE — Telephone Encounter (Signed)
Notified patient that RX has been sent to pharmacy  

## 2015-02-04 NOTE — Telephone Encounter (Signed)
Patient called and left message on triage line stating that she was seen in April or May for Ringworm. Patient states that she thinks she has it again and would like cream that was prescribed sent to pharmacy. Please advise

## 2015-02-04 NOTE — Telephone Encounter (Signed)
Rx sent 

## 2015-03-20 ENCOUNTER — Encounter: Payer: Self-pay | Admitting: Family Medicine

## 2015-03-20 ENCOUNTER — Ambulatory Visit (INDEPENDENT_AMBULATORY_CARE_PROVIDER_SITE_OTHER): Payer: BLUE CROSS/BLUE SHIELD | Admitting: Family Medicine

## 2015-03-20 VITALS — BP 126/92 | HR 107 | Temp 98.8°F | Ht 65.0 in | Wt 210.6 lb

## 2015-03-20 DIAGNOSIS — J209 Acute bronchitis, unspecified: Secondary | ICD-10-CM

## 2015-03-20 MED ORDER — ALBUTEROL SULFATE HFA 108 (90 BASE) MCG/ACT IN AERS: 2.0000 | INHALATION_SPRAY | Freq: Four times a day (QID) | RESPIRATORY_TRACT | Status: DC | PRN

## 2015-03-20 MED ORDER — DOXYCYCLINE HYCLATE 100 MG PO TABS: 100.0000 mg | ORAL_TABLET | Freq: Two times a day (BID) | ORAL | Status: DC

## 2015-03-20 NOTE — Assessment & Plan Note (Addendum)
Suspect bronchitis given cough and chest congestion and elevated temperature. Could also be a sinusitis. No focal lung findings to indicate pneumonia. Given that she responded well to albuterol at home we will give her an albuterol inhaler. We will additionally treat her with doxycycline. Patient reports she has received this previously and not had any reaction to it. I discussed treating with prednisone as well, though she declined this. She'll continue to monitor. She's given return precautions.

## 2015-03-20 NOTE — Progress Notes (Signed)
Pre visit review using our clinic review tool, if applicable. No additional management support is needed unless otherwise documented below in the visit note. 

## 2015-03-20 NOTE — Patient Instructions (Signed)
History she. You likely have a sinus infection or bronchitis or possibly both. We will treat with doxycycline and an albuterol inhaler. Please take a probiotic while on the antibiotic. If you develop chest pain, shortness of breath, fever that does not come down with antibiotic or ibuprofen, or any new or change in symptoms please seek medical attention.

## 2015-03-20 NOTE — Progress Notes (Addendum)
Patient ID: Diane Hahn, female   DOB: 1970-03-26, 45 y.o.   MRN: [id number]  Marikay Alar, MD Phone: [phone]  Diane Hahn is a 45 y.o. female who presents today for same-day visit.  Patient notes 3-4 days of cough and sinus congestion and chest congestion. She notes she blowing clear mucus out of her nose. Nonproductive cough. Some postnasal drip. Mild sneezing. She had a temperature of 100.53F yesterday. Mild wheezing yesterday. Uses nebulizer treatment and this helped a lot last night. No chest pain or shortness of breath. She does not feel well. She used Clarinex and this did not help. NyQuil helps some.  PMH: Former smoker.   ROS see history of present illness  Objective  Physical Exam Filed Vitals:   03/20/15 0957 03/20/15 1046  BP: 142/98 126/92  Pulse: 107   Temp: 98.8 F (37.1 C)     BP Readings from Last 3 Encounters:  03/20/15 126/92  01/10/15 155/60  10/16/14 133/92   Wt Readings from Last 3 Encounters:  03/20/15 210 lb 9.6 oz (95.528 kg)  01/10/15 195 lb (88.451 kg)  10/16/14 209 lb (94.802 kg)    Physical Exam  Constitutional: No distress.  Tired-appearing, nontoxic  HENT:  Head: Normocephalic and atraumatic.  Right Ear: External ear normal.  Left Ear: External ear normal.  Mild posterior oropharyngeal erythema, tonsillar exudate or swelling, nontender sinuses on percussion  Eyes: Conjunctivae are normal. Pupils are equal, round, and reactive to light.  Neck: Neck supple.  Cardiovascular: Normal rate, regular rhythm and normal heart sounds.  Exam reveals no gallop and no friction rub.   No murmur heard. Pulmonary/Chest: Effort normal. No respiratory distress. She has no wheezes. She has no rales.  Mildly coarse breath sounds  Musculoskeletal: She exhibits no edema.  Lymphadenopathy:    She has no cervical adenopathy.  Neurological: She is alert. Gait normal.  Skin: Skin is warm and dry. She is not diaphoretic.     Assessment/Plan:  Please see individual problem list.  Acute bronchitis Suspect bronchitis given cough and chest congestion and elevated temperature. Could also be a sinusitis. No focal lung findings to indicate pneumonia. Given that she responded well to albuterol at home we will give her an albuterol inhaler. We will additionally treat her with doxycycline. Patient reports she has received this previously and not had any reaction to it. I discussed treating with prednisone as well, though she declined this. She'll continue to monitor. She's given return precautions.    No orders of the defined types were placed in this encounter.    Meds ordered this encounter  Medications  . doxycycline (VIBRA-TABS) 100 MG tablet    Sig: Take 1 tablet (100 mg total) by mouth 2 (two) times daily.    Dispense:  14 tablet    Refill:  0  . albuterol (PROVENTIL HFA;VENTOLIN HFA) 108 (90 Base) MCG/ACT inhaler    Sig: Inhale 2 puffs into the lungs every 6 (six) hours as needed for wheezing or shortness of breath.    Dispense:  1 Inhaler    Refill:  2     Marikay Alar

## 2015-06-18 ENCOUNTER — Telehealth: Payer: Self-pay | Admitting: *Deleted

## 2015-06-18 NOTE — Telephone Encounter (Signed)
Please review when they are in your box

## 2015-06-18 NOTE — Telephone Encounter (Signed)
Left message for patient to schedule an appointment to discuss the sleep study results.

## 2015-06-18 NOTE — Telephone Encounter (Signed)
Patient stated that she had her sleep study results faxed over, once they are received please call patient to discuss results.  Pt Contact [phone]

## 2015-06-18 NOTE — Telephone Encounter (Signed)
Needs a follow up to discuss sleep study, however I have not seen this yet. 

## 2015-06-19 ENCOUNTER — Telehealth: Payer: Self-pay | Admitting: Internal Medicine

## 2015-06-19 NOTE — Telephone Encounter (Signed)
Sleep study showed no significant obstructive sleep apnea.

## 2015-06-20 NOTE — Telephone Encounter (Signed)
Patient stated that she knew her results and has made an appt.

## 2015-06-20 NOTE — Telephone Encounter (Signed)
Called pt didn't get an answer will try again later  

## 2015-07-15 ENCOUNTER — Encounter: Payer: Self-pay | Admitting: Internal Medicine

## 2015-07-15 ENCOUNTER — Ambulatory Visit (INDEPENDENT_AMBULATORY_CARE_PROVIDER_SITE_OTHER): Payer: BLUE CROSS/BLUE SHIELD | Admitting: Internal Medicine

## 2015-07-15 VITALS — BP 118/20 | Ht 65.0 in | Wt 211.2 lb

## 2015-07-15 DIAGNOSIS — E079 Disorder of thyroid, unspecified: Secondary | ICD-10-CM | POA: Diagnosis not present

## 2015-07-15 DIAGNOSIS — R5383 Other fatigue: Secondary | ICD-10-CM | POA: Diagnosis not present

## 2015-07-15 DIAGNOSIS — R7989 Other specified abnormal findings of blood chemistry: Secondary | ICD-10-CM

## 2015-07-15 DIAGNOSIS — Z0000 Encounter for general adult medical examination without abnormal findings: Secondary | ICD-10-CM | POA: Diagnosis not present

## 2015-07-15 DIAGNOSIS — Z0001 Encounter for general adult medical examination with abnormal findings: Secondary | ICD-10-CM | POA: Diagnosis not present

## 2015-07-15 LAB — COMPREHENSIVE METABOLIC PANEL
ALBUMIN: 4.6 g/dL (ref 3.5–5.2)
ALK PHOS: 82 U/L (ref 39–117)
ALT: 45 U/L — AB (ref 0–35)
AST: 75 U/L — ABNORMAL HIGH (ref 0–37)
BILIRUBIN TOTAL: 0.5 mg/dL (ref 0.2–1.2)
BUN: 10 mg/dL (ref 6–23)
CO2: 30 mEq/L (ref 19–32)
CREATININE: 0.72 mg/dL (ref 0.40–1.20)
Calcium: 9.9 mg/dL (ref 8.4–10.5)
Chloride: 103 mEq/L (ref 96–112)
GFR: 93.57 mL/min (ref >60.00)
Glucose, Bld: 114 mg/dL — ABNORMAL HIGH (ref 70–99)
POTASSIUM: 4.2 meq/L (ref 3.5–5.1)
SODIUM: 139 meq/L (ref 135–145)
TOTAL PROTEIN: 7 g/dL (ref 6.0–8.3)

## 2015-07-15 LAB — LIPID PANEL
CHOLESTEROL: 187 mg/dL (ref 0–200)
HDL: 36.6 mg/dL — ABNORMAL LOW (ref >39.00)
NonHDL: 150.31
Total CHOL/HDL Ratio: 5
Triglycerides: 351 mg/dL — ABNORMAL HIGH (ref 0.0–149.0)
VLDL: 70.2 mg/dL — AB (ref 0.0–40.0)

## 2015-07-15 LAB — LDL CHOLESTEROL, DIRECT: LDL DIRECT: 112 mg/dL

## 2015-07-15 LAB — CBC WITH DIFFERENTIAL/PLATELET
Basophils Absolute: 0 10*3/uL (ref 0.0–0.1)
Basophils Relative: 0.5 % (ref 0.0–3.0)
EOS PCT: 2 % (ref 0.0–5.0)
Eosinophils Absolute: 0.1 10*3/uL (ref 0.0–0.7)
HCT: 39.9 % (ref 36.0–46.0)
HEMOGLOBIN: 13.4 g/dL (ref 12.0–15.0)
Lymphocytes Relative: 20.7 % (ref 12.0–46.0)
Lymphs Abs: 1.3 10*3/uL (ref 0.7–4.0)
MCHC: 33.6 g/dL (ref 30.0–36.0)
MCV: 92.5 fl (ref 78.0–100.0)
MONO ABS: 0.5 10*3/uL (ref 0.1–1.0)
MONOS PCT: 7.8 % (ref 3.0–12.0)
Neutro Abs: 4.4 10*3/uL (ref 1.4–7.7)
Neutrophils Relative %: 69 % (ref 43.0–77.0)
Platelets: 268 10*3/uL (ref 150.0–400.0)
RBC: 4.32 Mil/uL (ref 3.87–5.11)
RDW: 14 % (ref 11.5–15.5)
WBC: 6.4 10*3/uL (ref 4.0–10.5)

## 2015-07-15 LAB — FERRITIN: FERRITIN: 126.1 ng/mL (ref 10.0–291.0)

## 2015-07-15 LAB — TSH: TSH: 4.86 u[IU]/mL — AB (ref 0.35–4.50)

## 2015-07-15 LAB — VITAMIN D 25 HYDROXY (VIT D DEFICIENCY, FRACTURES): VITD: 22.48 ng/mL — ABNORMAL LOW (ref 30.00–100.00)

## 2015-07-15 LAB — VITAMIN B12: VITAMIN B 12: 224 pg/mL (ref 211–911)

## 2015-07-15 MED ORDER — DESLORATADINE 5 MG PO TABS: ORAL_TABLET | ORAL | Status: DC

## 2015-07-15 NOTE — Assessment & Plan Note (Signed)
General medical exam including breast exam normal today. PAP and pelvic deferred as PAP normal 2016, HPV neg. Mammogram ordered. Labs ordered. Encouraged healthy diet and exercise.

## 2015-07-15 NOTE — Progress Notes (Signed)
Pre visit review using our clinic review tool, if applicable. No additional management support is needed unless otherwise documented below in the visit note. 

## 2015-07-15 NOTE — Assessment & Plan Note (Signed)
Generalized fatigue. Will request hard copy of recent sleep study. Reported to be normal. Discussed potential causes for fatigue. Likely multifactorial. Will check CBC, ferritin, B12, and TSH with labs.

## 2015-07-15 NOTE — Assessment & Plan Note (Signed)
Reviewed previous endocrinology notes and labs which were normal. She is concerned that all labs have not been tested and that her symptoms of fatigue and weight gain are related to thyroid abnormality. Tried to reassure her that there is no evidence for this. [facility].

## 2015-07-15 NOTE — Progress Notes (Signed)
Subjective:    Patient ID: Diane Hahn, female    DOB: Dec 01, 1970, 45 y.o.   MRN: [id number]  HPI  45YO female presents for physical exam.  Recently had endometrial ablation. Now having light menses.  Frustrated by weight gain and fatigue. Reports no change in diet. Reports healthy diet. Exercises occasionally. Attributes this and fatigue to thyroid dysfunction. Would like a second opinion at [facility].  Wt Readings from Last 3 Encounters:  07/15/15 211 lb 3.2 oz (95.8 kg)  03/20/15 210 lb 9.6 oz (95.528 kg)  01/10/15 195 lb (88.451 kg)   BP Readings from Last 3 Encounters:  07/15/15 118/20  03/20/15 126/92  01/10/15 155/60    Past Medical History  Diagnosis Date  . Panic attack   . HLD (hyperlipidemia)   . Positive TB test    Family History  Problem Relation Age of Onset  . Coronary artery disease      family hx; and of valvular disease  . Sudden death      family hx  . Diabetes      family hx  . Hyperlipidemia      family hx  . Hypertension      family hx  . Cancer      family hx  . Thyroid disease      family hx   . Heart disease Mother [id number]    s/p stent  . Hyperlipidemia Mother   . Heart disease Father    Past Surgical History  Procedure Laterality Date  . Ovary and cyst removal    . Tonsillectomy    . Breast reduction surgery    . Foot surgery    . Cesarean section  2009, 2012  . Dilation and curettage of uterus  2006  . Breast biopsy  2005  . Tubal ligation  05/25/10  . Endometrial ablation  2016   Social History   Social History  . Marital Status: Married    Spouse Name: N/A  . Number of Children: N/A  . Years of Education: N/A   Social History Main Topics  . Smoking status: Former Games developermoker  . Smokeless tobacco: None     Comment: quit in 2008  . Alcohol Use: Yes     Comment: 1-2 glasses of wine with dinner   . Drug Use: No  . Sexual Activity: Not Asked   Other Topics Concern  . 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     Review of Systems  Constitutional: Positive for fatigue. Negative for fever, chills, appetite change and unexpected weight change.  Eyes: Negative for visual disturbance.  Respiratory: Negative for cough and shortness of breath.   Cardiovascular: Negative for chest pain, palpitations and leg swelling.  Gastrointestinal: Negative for nausea, vomiting, abdominal pain, diarrhea and constipation.  Genitourinary: Negative for vaginal bleeding, vaginal discharge and vaginal pain.  Skin: Negative for color change and rash.  Hematological: Negative for adenopathy. Does not bruise/bleed easily.  Psychiatric/Behavioral: Negative for sleep disturbance and dysphoric mood. The patient is not nervous/anxious.        Objective:    BP 118/20 mmHg  Ht 5\' 5"  (1.651 m)  Wt 211 lb 3.2 oz (95.8 kg)  BMI 35.15 kg/m2  LMP 07/14/2015 Physical Exam  Constitutional: She is oriented to person, place, and time. She appears well-developed and well-nourished. No distress.  HENT:  Head: Normocephalic and atraumatic.  Right Ear: External ear normal.  Left Ear: External ear  normal.  Nose: Nose normal.  Mouth/Throat: Oropharynx is clear and moist. No oropharyngeal exudate.  Eyes: Conjunctivae are normal. Pupils are equal, round, and reactive to light. Right eye exhibits no discharge. Left eye exhibits no discharge. No scleral icterus.  Neck: Normal range of motion. Neck supple. No tracheal deviation present. No thyromegaly present.  Cardiovascular: Normal rate, regular rhythm, normal heart sounds and intact distal pulses.  Exam reveals no gallop and no friction rub.   No murmur heard. Pulmonary/Chest: Effort normal and breath sounds normal. No accessory muscle usage. No tachypnea. No respiratory distress. She has no decreased breath sounds. She has no wheezes. She has no rhonchi. She has no rales. She exhibits no tenderness. Right breast exhibits no inverted nipple, no mass, no nipple discharge,  no skin change and no tenderness. Left breast exhibits no inverted nipple, no mass, no nipple discharge, no skin change and no tenderness. Breasts are symmetrical.    Healed scar at site of previous breast reduction surgery.  Abdominal: Soft. Bowel sounds are normal. She exhibits no distension and no mass. There is no tenderness. There is no rebound and no guarding.  Musculoskeletal: Normal range of motion. She exhibits no edema or tenderness.  Lymphadenopathy:    She has no cervical adenopathy.  Neurological: She is alert and oriented to person, place, and time. No cranial nerve deficit. She exhibits normal muscle tone. Coordination normal.  Skin: Skin is warm and dry. No rash noted. She is not diaphoretic. No erythema. No pallor.  Psychiatric: She has a normal mood and affect. Her behavior is normal. Judgment and thought content normal.          Assessment & Plan:   Problem List Items Addressed This Visit      Unprioritized   Other fatigue    Generalized fatigue. Will request hard copy of recent sleep study. Reported to be normal. Discussed potential causes for fatigue. Likely multifactorial. Will check CBC, ferritin, B12, and TSH with labs.      Relevant Orders   B12   Ferritin   Routine general medical examination at a health care facility - Primary    General medical exam including breast exam normal today. PAP and pelvic deferred as PAP normal 2016, HPV neg. Mammogram ordered. Labs ordered. Encouraged healthy diet and exercise.       Relevant Orders   MM Digital Screening   TSH   CBC with Differential/Platelet   Comprehensive metabolic panel   Lipid panel   VITAMIN D 25 Hydroxy (Vit-D Deficiency, Fractures)   Thyroid dysfunction    Reviewed previous endocrinology notes and labs which were normal. She is concerned that all labs have not been tested and that her symptoms of fatigue and weight gain are related to thyroid abnormality. Tried to reassure her that there is no  evidence for this. She requests referral to [facility] for second opinion.      Relevant Orders   Ambulatory referral to Endocrinology       Return in about 1 year (around 07/14/2016) for Physical.  Ronna Polio, MD Internal Medicine [facility]

## 2015-07-15 NOTE — Patient Instructions (Signed)
Health Maintenance, Female Adopting a healthy lifestyle and getting preventive care can go a long way to promote health and wellness. Talk with your health care provider about what schedule of regular examinations is right for you. This is a good chance for you to check in with your provider about disease prevention and staying healthy. In between checkups, there are plenty of things you can do on your own. Experts have done a lot of research about which lifestyle changes and preventive measures are most likely to keep you healthy. Ask your health care provider for more information. WEIGHT AND DIET  Eat a healthy diet  Be sure to include plenty of vegetables, fruits, low-fat dairy products, and lean protein.  Do not eat a lot of foods high in solid fats, added sugars, or salt.  Get regular exercise. This is one of the most important things you can do for your health.  Most adults should exercise for at least 150 minutes each week. The exercise should increase your heart rate and make you sweat (moderate-intensity exercise).  Most adults should also do strengthening exercises at least twice a week. This is in addition to the moderate-intensity exercise.  Maintain a healthy weight  Body mass index (BMI) is a measurement that can be used to identify possible weight problems. It estimates body fat based on height and weight. Your health care provider can help determine your BMI and help you achieve or maintain a healthy weight.  For females 20 years of age and older:   A BMI below 18.5 is considered underweight.  A BMI of 18.5 to 24.9 is normal.  A BMI of 25 to 29.9 is considered overweight.  A BMI of 30 and above is considered obese.  Watch levels of cholesterol and blood lipids  You should start having your blood tested for lipids and cholesterol at 44 years of age, then have this test every 5 years.  You may need to have your cholesterol levels checked more often if:  Your lipid  or cholesterol levels are high.  You are older than 44 years of age.  You are at high risk for heart disease.  CANCER SCREENING   Lung Cancer  Lung cancer screening is recommended for adults 55-80 years old who are at high risk for lung cancer because of a history of smoking.  A yearly low-dose CT scan of the lungs is recommended for people who:  Currently smoke.  Have quit within the past 15 years.  Have at least a 30-pack-year history of smoking. A pack year is smoking an average of one pack of cigarettes a day for 1 year.  Yearly screening should continue until it has been 15 years since you quit.  Yearly screening should stop if you develop a health problem that would prevent you from having lung cancer treatment.  Breast Cancer  Practice breast self-awareness. This means understanding how your breasts normally appear and feel.  It also means doing regular breast self-exams. Let your health care provider know about any changes, no matter how small.  If you are in your 20s or 30s, you should have a clinical breast exam (CBE) by a health care provider every 1-3 years as part of a regular health exam.  If you are 40 or older, have a CBE every year. Also consider having a breast X-ray (mammogram) every year.  If you have a family history of breast cancer, talk to your health care provider about genetic screening.  If you   are at high risk for breast cancer, talk to your health care provider about having an MRI and a mammogram every year.  Breast cancer gene (BRCA) assessment is recommended for women who have family members with BRCA-related cancers. BRCA-related cancers include:  Breast.  Ovarian.  Tubal.  Peritoneal cancers.  Results of the assessment will determine the need for genetic counseling and BRCA1 and BRCA2 testing. Cervical Cancer Your health care provider may recommend that you be screened regularly for cancer of the pelvic organs (ovaries, uterus, and  vagina). This screening involves a pelvic examination, including checking for microscopic changes to the surface of your cervix (Pap test). You may be encouraged to have this screening done every 3 years, beginning at age 21.  For women ages 30-65, health care providers may recommend pelvic exams and Pap testing every 3 years, or they may recommend the Pap and pelvic exam, combined with testing for human papilloma virus (HPV), every 5 years. Some types of HPV increase your risk of cervical cancer. Testing for HPV may also be done on women of any age with unclear Pap test results.  Other health care providers may not recommend any screening for nonpregnant women who are considered low risk for pelvic cancer and who do not have symptoms. Ask your health care provider if a screening pelvic exam is right for you.  If you have had past treatment for cervical cancer or a condition that could lead to cancer, you need Pap tests and screening for cancer for at least 20 years after your treatment. If Pap tests have been discontinued, your risk factors (such as having a new sexual partner) need to be reassessed to determine if screening should resume. Some women have medical problems that increase the chance of getting cervical cancer. In these cases, your health care provider may recommend more frequent screening and Pap tests. Colorectal Cancer  This type of cancer can be detected and often prevented.  Routine colorectal cancer screening usually begins at 44 years of age and continues through 44 years of age.  Your health care provider may recommend screening at an earlier age if you have risk factors for colon cancer.  Your health care provider may also recommend using home test kits to check for hidden blood in the stool.  A small camera at the end of a tube can be used to examine your colon directly (sigmoidoscopy or colonoscopy). This is done to check for the earliest forms of colorectal  cancer.  Routine screening usually begins at age 50.  Direct examination of the colon should be repeated every 5-10 years through 44 years of age. However, you may need to be screened more often if early forms of precancerous polyps or small growths are found. Skin Cancer  Check your skin from head to toe regularly.  Tell your health care provider about any new moles or changes in moles, especially if there is a change in a mole's shape or color.  Also tell your health care provider if you have a mole that is larger than the size of a pencil eraser.  Always use sunscreen. Apply sunscreen liberally and repeatedly throughout the day.  Protect yourself by wearing long sleeves, pants, a wide-brimmed hat, and sunglasses whenever you are outside. HEART DISEASE, DIABETES, AND HIGH BLOOD PRESSURE   High blood pressure causes heart disease and increases the risk of stroke. High blood pressure is more likely to develop in:  People who have blood pressure in the high end   of the normal range (130-139/85-89 mm Hg).  People who are overweight or obese.  People who are African American.  If you are 38-23 years of age, have your blood pressure checked every 3-5 years. If you are 61 years of age or older, have your blood pressure checked every year. You should have your blood pressure measured twice--once when you are at a hospital or clinic, and once when you are not at a hospital or clinic. Record the average of the two measurements. To check your blood pressure when you are not at a hospital or clinic, you can use:  An automated blood pressure machine at a pharmacy.  A home blood pressure monitor.  If you are between 45 years and 39 years old, ask your health care provider if you should take aspirin to prevent strokes.  Have regular diabetes screenings. This involves taking a blood sample to check your fasting blood sugar level.  If you are at a normal weight and have a low risk for diabetes,  have this test once every three years after 44 years of age.  If you are overweight and have a high risk for diabetes, consider being tested at a younger age or more often. PREVENTING INFECTION  Hepatitis B  If you have a higher risk for hepatitis B, you should be screened for this virus. You are considered at high risk for hepatitis B if:  You were born in a country where hepatitis B is common. Ask your health care provider which countries are considered high risk.  Your parents were born in a high-risk country, and you have not been immunized against hepatitis B (hepatitis B vaccine).  You have HIV or AIDS.  You use needles to inject street drugs.  You live with someone who has hepatitis B.  You have had sex with someone who has hepatitis B.  You get hemodialysis treatment.  You take certain medicines for conditions, including cancer, organ transplantation, and autoimmune conditions. Hepatitis C  Blood testing is recommended for:  Everyone born from [birth year] through 1964.  Anyone with known risk factors for hepatitis C. Sexually transmitted infections (STIs)  You should be screened for sexually transmitted infections (STIs) including gonorrhea and chlamydia if:  You are sexually active and are younger than 44 years of age.  You are older than 44 years of age and your health care provider tells you that you are at risk for this type of infection.  Your sexual activity has changed since you were last screened and you are at an increased risk for chlamydia or gonorrhea. Ask your health care provider if you are at risk.  If you do not have HIV, but are at risk, it may be recommended that you take a prescription medicine daily to prevent HIV infection. This is called pre-exposure prophylaxis (PrEP). You are considered at risk if:  You are sexually active and do not regularly use condoms or know the HIV status of your partner(s).  You take drugs by injection.  You are sexually  active with a partner who has HIV. Talk with your health care provider about whether you are at high risk of being infected with HIV. If you choose to begin PrEP, you should first be tested for HIV. You should then be tested every 3 months for as long as you are taking PrEP.  PREGNANCY   If you are premenopausal and you may become pregnant, ask your health care provider about preconception counseling.  If you may  become pregnant, take 400 to 800 micrograms (mcg) of folic acid every day.  If you want to prevent pregnancy, talk to your health care provider about birth control (contraception). OSTEOPOROSIS AND MENOPAUSE   Osteoporosis is a disease in which the bones lose minerals and strength with aging. This can result in serious bone fractures. Your risk for osteoporosis can be identified using a bone density scan.  If you are 61 years of age or older, or if you are at risk for osteoporosis and fractures, ask your health care provider if you should be screened.  Ask your health care provider whether you should take a calcium or vitamin D supplement to lower your risk for osteoporosis.  Menopause may have certain physical symptoms and risks.  Hormone replacement therapy may reduce some of these symptoms and risks. Talk to your health care provider about whether hormone replacement therapy is right for you.  HOME CARE INSTRUCTIONS   Schedule regular health, dental, and eye exams.  Stay current with your immunizations.   Do not use any tobacco products including cigarettes, chewing tobacco, or electronic cigarettes.  If you are pregnant, do not drink alcohol.  If you are breastfeeding, limit how much and how often you drink alcohol.  Limit alcohol intake to no more than 1 drink per day for nonpregnant women. One drink equals 12 ounces of beer, 5 ounces of wine, or 1 ounces of hard liquor.  Do not use street drugs.  Do not share needles.  Ask your health care provider for help if  you need support or information about quitting drugs.  Tell your health care provider if you often feel depressed.  Tell your health care provider if you have ever been abused or do not feel safe at home.   This information is not intended to replace advice given to you by your health care provider. Make sure you discuss any questions you have with your health care provider.   Document Released: 08/10/2010 Document Revised: 02/15/2014 Document Reviewed: 12/27/2012 Elsevier Interactive Patient Education [employer].

## 2015-07-17 ENCOUNTER — Other Ambulatory Visit: Payer: Self-pay | Admitting: Internal Medicine

## 2015-07-17 MED ORDER — LEVOTHYROXINE SODIUM 75 MCG PO TABS: 75.0000 ug | ORAL_TABLET | Freq: Every day | ORAL | Status: DC

## 2015-07-22 ENCOUNTER — Ambulatory Visit (INDEPENDENT_AMBULATORY_CARE_PROVIDER_SITE_OTHER): Payer: BLUE CROSS/BLUE SHIELD | Admitting: *Deleted

## 2015-07-22 DIAGNOSIS — E538 Deficiency of other specified B group vitamins: Secondary | ICD-10-CM | POA: Diagnosis not present

## 2015-07-22 MED ORDER — CYANOCOBALAMIN 1000 MCG/ML IJ SOLN
1000.0000 ug | Freq: Once | INTRAMUSCULAR | Status: AC
  Administered 2015-07-22: 1000 ug via INTRAMUSCULAR

## 2015-07-22 NOTE — Progress Notes (Signed)
Patient presented for first B 12 injection to deltoid. Patient was ordered B 12  Injections by MD on lab report from 07/15/15.

## 2015-08-21 ENCOUNTER — Ambulatory Visit (INDEPENDENT_AMBULATORY_CARE_PROVIDER_SITE_OTHER): Payer: BLUE CROSS/BLUE SHIELD

## 2015-08-21 ENCOUNTER — Telehealth: Payer: Self-pay

## 2015-08-21 DIAGNOSIS — E538 Deficiency of other specified B group vitamins: Secondary | ICD-10-CM

## 2015-08-21 MED ORDER — CYANOCOBALAMIN 1000 MCG/ML IJ SOLN
1000.0000 ug | Freq: Once | INTRAMUSCULAR | Status: AC
  Administered 2015-08-21: 1000 ug via INTRAMUSCULAR

## 2015-08-21 NOTE — Telephone Encounter (Signed)
She will have them drawn at next appt. Thanks

## 2015-08-21 NOTE — Telephone Encounter (Signed)
Patient came in for b12 injection, wanted to know when her labs should be redrawn.  She increased her Thyroid meds, started Vitamin D supplements and b12 injections based off labs a month ago, when would you like to have them redrawn? Thanks FYI: she is going to follow her care with Jason CoopArnett, NP.

## 2015-08-21 NOTE — Telephone Encounter (Signed)
OK. Check TSH, CBC, B12, Vit D

## 2015-08-21 NOTE — Progress Notes (Signed)
Patient came in for a b12 injection. Received in Right deltoid.  Patient tolerated well. thanks

## 2015-09-23 ENCOUNTER — Ambulatory Visit: Payer: BLUE CROSS/BLUE SHIELD

## 2015-09-24 ENCOUNTER — Ambulatory Visit: Payer: BLUE CROSS/BLUE SHIELD | Attending: Internal Medicine

## 2015-10-14 ENCOUNTER — Other Ambulatory Visit: Payer: Self-pay

## 2015-10-14 MED ORDER — ESCITALOPRAM OXALATE 20 MG PO TABS: 20.0000 mg | ORAL_TABLET | Freq: Every day | ORAL | 3 refills | Status: DC

## 2015-10-16 ENCOUNTER — Other Ambulatory Visit: Payer: Self-pay | Admitting: Family Medicine

## 2015-10-16 NOTE — Telephone Encounter (Signed)
Please advise 

## 2015-10-17 ENCOUNTER — Encounter: Payer: Self-pay | Admitting: Podiatry

## 2015-10-17 ENCOUNTER — Ambulatory Visit (INDEPENDENT_AMBULATORY_CARE_PROVIDER_SITE_OTHER): Payer: BLUE CROSS/BLUE SHIELD

## 2015-10-17 ENCOUNTER — Ambulatory Visit (INDEPENDENT_AMBULATORY_CARE_PROVIDER_SITE_OTHER): Payer: BLUE CROSS/BLUE SHIELD | Admitting: Podiatry

## 2015-10-17 VITALS — BP 137/90 | HR 80 | Resp 16

## 2015-10-17 DIAGNOSIS — M79672 Pain in left foot: Secondary | ICD-10-CM

## 2015-10-17 DIAGNOSIS — M722 Plantar fascial fibromatosis: Secondary | ICD-10-CM | POA: Diagnosis not present

## 2015-10-17 DIAGNOSIS — M79671 Pain in right foot: Secondary | ICD-10-CM

## 2015-10-17 MED ORDER — BETAMETHASONE SOD PHOS & ACET 6 (3-3) MG/ML IJ SUSP: 12.0000 mg | Freq: Once | INTRAMUSCULAR | Status: DC

## 2015-10-17 MED ORDER — DICLOFENAC SODIUM 75 MG PO TBEC: 75.0000 mg | DELAYED_RELEASE_TABLET | Freq: Two times a day (BID) | ORAL | 0 refills | Status: DC

## 2015-10-17 NOTE — Patient Instructions (Signed)

## 2015-10-17 NOTE — Progress Notes (Signed)
Patient ID: Diane Hahn, female   DOB: [birth year], 45 y.o.   MRN: [id number]   Subjective: Patient presents today as a new patient with medial plantar heel pain to the left lower extremity. This pain has been ongoing for the past few weeks. Patient presents today for further treatment and evaluation.  Objective: Physical Exam General: The patient is alert and oriented x3 in no acute distress.  Dermatology: Skin is warm, dry and supple bilateral lower extremities. Negative for open lesions or macerations bilateral.   Vascular: Dorsalis Pedis and Posterior Tibial pulses palpable bilateral.  Capillary fill time is immediate to all digits.  Neurological: Epicritic and protective threshold intact bilateral.   Musculoskeletal: Tenderness to palpation at the medial calcaneal tubercale and through the insertion of the plantar fascia of the left foot. All other joints range of motion within normal limits bilateral. Strength 5/5 in all groups bilateral.   Radiographic exam:   Normal osseous mineralization. Joint spaces preserved. No fracture/dislocation/boney destruction. Calcaneal spur present with mild thickening of plantar fascia left. No other soft tissue abnormalities or radiopaque foreign bodies.   Assessment:  Problem List Items Addressed This Visit    None    Visit Diagnoses    Right foot pain    -  Primary   Relevant Orders   DG Foot Complete Right   Plantar fasciitis of right foot       Relevant Medications   betamethasone acetate-betamethasone sodium phosphate (CELESTONE) injection 12 mg   Pain in right foot       Relevant Medications   betamethasone acetate-betamethasone sodium phosphate (CELESTONE) injection 12 mg       Plan of Care:   1. Patient evaluated. Xrays reviewed.   2. Injection of 0.5cc Celestone soluspan injected into the left plantar fascia.  3. Instructed patient regarding therapies and modalities at home to alleviate symptoms.  4. Rx for Diclofenac 75mg   60 BID given to patient.  5. Plantar fascial band(s) dispensed. 6. Return to clinic in 4 weeks.

## 2015-10-17 NOTE — Progress Notes (Signed)
   Subjective:    Patient ID: Diane Hahn, female    DOB: 12-01-1970, [age] y.o.   MRN: [id number]  HPI    Review of Systems  Musculoskeletal: Positive for gait problem.  All other systems reviewed and are negative.      Objective:   Physical Exam        Assessment & Plan:

## 2015-11-07 ENCOUNTER — Other Ambulatory Visit: Payer: Self-pay | Admitting: Family Medicine

## 2015-11-10 ENCOUNTER — Other Ambulatory Visit: Payer: Self-pay | Admitting: *Deleted

## 2015-11-10 DIAGNOSIS — Z1231 Encounter for screening mammogram for malignant neoplasm of breast: Secondary | ICD-10-CM

## 2015-11-13 ENCOUNTER — Other Ambulatory Visit: Payer: Self-pay

## 2015-11-13 MED ORDER — LEVOTHYROXINE SODIUM 75 MCG PO TABS: 75.0000 ug | ORAL_TABLET | Freq: Every day | ORAL | 3 refills | Status: DC

## 2015-11-13 MED ORDER — ROSUVASTATIN CALCIUM 5 MG PO TABS: 5.0000 mg | ORAL_TABLET | Freq: Every day | ORAL | 3 refills | Status: DC

## 2015-11-13 NOTE — Telephone Encounter (Signed)
Medication has been refill

## 2015-11-14 ENCOUNTER — Encounter: Payer: Self-pay | Admitting: Podiatry

## 2015-11-14 ENCOUNTER — Ambulatory Visit (INDEPENDENT_AMBULATORY_CARE_PROVIDER_SITE_OTHER): Payer: BLUE CROSS/BLUE SHIELD | Admitting: Podiatry

## 2015-11-14 DIAGNOSIS — M79671 Pain in right foot: Secondary | ICD-10-CM

## 2015-11-14 DIAGNOSIS — M722 Plantar fascial fibromatosis: Secondary | ICD-10-CM

## 2015-11-15 NOTE — Progress Notes (Signed)
Subjective:  Patient presents today for follow-up evaluation of medial plantar heel pain to the right lower extremity. Patient states that is feeling significantly better. Patient states improvement as approximately 90%.    Objective/Physical Exam General: The patient is alert and oriented x3 in no acute distress.  Dermatology: Skin is warm, dry and supple bilateral lower extremities. Negative for open lesions or macerations.  Vascular: Palpable pedal pulses bilaterally. No edema or erythema noted. Capillary refill within normal limits.  Neurological: Epicritic and protective threshold grossly intact bilaterally.   Musculoskeletal Exam: Minimal pain on palpation to the right plantar medial heel.  Range of motion within normal limits to all pedal and ankle joints bilateral. Muscle strength 5/5 in all groups bilateral.   Assessment: #1 plantar fasciitis right #2 pain in right foot   Plan of Care:  #1 Patient was evaluated. #2 today prescription for anti-inflammatory pain cream was dispensed through [facility] #3 today the patient was scanned for custom molded orthotics. #4 patient is to return to clinic in 3 weeks orthotics pickup.   Dr. Felecia ShellingBrent M. Adilenne Ashworth, DPM [facility]

## 2015-12-03 ENCOUNTER — Encounter: Payer: Self-pay | Admitting: Podiatry

## 2016-04-28 ENCOUNTER — Other Ambulatory Visit: Payer: Self-pay

## 2016-04-28 MED ORDER — DESLORATADINE 5 MG PO TABS: ORAL_TABLET | ORAL | 1 refills | Status: AC

## 2016-04-28 MED ORDER — ESCITALOPRAM OXALATE 20 MG PO TABS: 20.0000 mg | ORAL_TABLET | Freq: Every day | ORAL | 0 refills | Status: DC

## 2016-04-28 MED ORDER — ROSUVASTATIN CALCIUM 5 MG PO TABS: 5.0000 mg | ORAL_TABLET | Freq: Every day | ORAL | 3 refills | Status: DC

## 2016-04-28 MED ORDER — AMLODIPINE BESYLATE 5 MG PO TABS: 5.0000 mg | ORAL_TABLET | Freq: Every day | ORAL | 1 refills | Status: DC

## 2016-04-28 NOTE — Telephone Encounter (Signed)
Medication has been refilled per proticol

## 2016-05-11 ENCOUNTER — Other Ambulatory Visit: Payer: Self-pay | Admitting: Family

## 2016-07-15 ENCOUNTER — Encounter: Payer: BLUE CROSS/BLUE SHIELD | Admitting: Internal Medicine

## 2016-07-26 ENCOUNTER — Other Ambulatory Visit: Payer: BLUE CROSS/BLUE SHIELD

## 2016-07-26 ENCOUNTER — Encounter: Payer: BLUE CROSS/BLUE SHIELD | Admitting: Family

## 2016-12-28 ENCOUNTER — Other Ambulatory Visit: Payer: Self-pay | Admitting: Family

## 2017-01-24 ENCOUNTER — Other Ambulatory Visit: Payer: Self-pay | Admitting: Family

## 2017-01-24 NOTE — Telephone Encounter (Signed)
Refilled: 04/28/2016 Last OV: 07/15/2015 Next OV: not scheduled

## 2017-02-03 ENCOUNTER — Other Ambulatory Visit: Payer: Self-pay | Admitting: *Deleted

## 2017-02-03 DIAGNOSIS — Z1231 Encounter for screening mammogram for malignant neoplasm of breast: Secondary | ICD-10-CM

## 2017-02-10 ENCOUNTER — Other Ambulatory Visit: Payer: Self-pay | Admitting: Family

## 2017-02-23 ENCOUNTER — Ambulatory Visit
Admission: RE | Admit: 2017-02-23 | Discharge: 2017-02-23 | Disposition: A | Discharge status: Home / Self Care | Payer: BLUE CROSS/BLUE SHIELD | Source: Ambulatory Visit | Attending: *Deleted | Admitting: *Deleted

## 2017-02-23 ENCOUNTER — Encounter: Payer: Self-pay | Admitting: Radiology

## 2017-02-23 DIAGNOSIS — Z1231 Encounter for screening mammogram for malignant neoplasm of breast: Secondary | ICD-10-CM | POA: Insufficient documentation

## 2017-05-25 DIAGNOSIS — E7211 Homocystinuria: Secondary | ICD-10-CM | POA: Insufficient documentation

## 2017-05-25 DIAGNOSIS — E786 Lipoprotein deficiency: Secondary | ICD-10-CM | POA: Insufficient documentation

## 2018-03-15 ENCOUNTER — Other Ambulatory Visit: Payer: Self-pay | Admitting: *Deleted

## 2018-03-15 DIAGNOSIS — Z1231 Encounter for screening mammogram for malignant neoplasm of breast: Secondary | ICD-10-CM

## 2018-03-20 ENCOUNTER — Ambulatory Visit
Admission: RE | Admit: 2018-03-20 | Discharge: 2018-03-20 | Disposition: A | Discharge status: Home / Self Care | Payer: BLUE CROSS/BLUE SHIELD | Source: Ambulatory Visit | Attending: *Deleted | Admitting: *Deleted

## 2018-03-20 DIAGNOSIS — Z1231 Encounter for screening mammogram for malignant neoplasm of breast: Secondary | ICD-10-CM | POA: Insufficient documentation

## 2019-02-19 DIAGNOSIS — B029 Zoster without complications: Secondary | ICD-10-CM | POA: Insufficient documentation

## 2019-02-19 DIAGNOSIS — S92909A Unspecified fracture of unspecified foot, initial encounter for closed fracture: Secondary | ICD-10-CM | POA: Insufficient documentation

## 2019-02-19 DIAGNOSIS — Z8611 Personal history of tuberculosis: Secondary | ICD-10-CM | POA: Insufficient documentation

## 2019-02-19 HISTORY — DX: Zoster without complications: B02.9

## 2019-02-19 HISTORY — DX: Unspecified fracture of unspecified foot, initial encounter for closed fracture: S92.909A

## 2019-03-14 ENCOUNTER — Other Ambulatory Visit: Payer: Self-pay | Admitting: *Deleted

## 2019-03-14 DIAGNOSIS — R748 Abnormal levels of other serum enzymes: Secondary | ICD-10-CM

## 2019-03-21 ENCOUNTER — Other Ambulatory Visit: Payer: Self-pay | Admitting: *Deleted

## 2019-03-21 DIAGNOSIS — Z1231 Encounter for screening mammogram for malignant neoplasm of breast: Secondary | ICD-10-CM

## 2019-04-30 ENCOUNTER — Other Ambulatory Visit: Payer: Self-pay

## 2019-04-30 ENCOUNTER — Ambulatory Visit
Admission: RE | Admit: 2019-04-30 | Discharge: 2019-04-30 | Disposition: A | Discharge status: Home / Self Care | Payer: BC Managed Care – PPO | Source: Ambulatory Visit | Attending: *Deleted | Admitting: *Deleted

## 2019-04-30 DIAGNOSIS — R748 Abnormal levels of other serum enzymes: Secondary | ICD-10-CM

## 2019-05-29 ENCOUNTER — Ambulatory Visit
Admission: RE | Admit: 2019-05-29 | Discharge: 2019-05-29 | Disposition: A | Discharge status: Home / Self Care | Payer: BC Managed Care – PPO | Source: Ambulatory Visit | Attending: *Deleted | Admitting: *Deleted

## 2019-05-29 DIAGNOSIS — Z1231 Encounter for screening mammogram for malignant neoplasm of breast: Secondary | ICD-10-CM | POA: Diagnosis not present

## 2019-06-04 ENCOUNTER — Other Ambulatory Visit: Payer: Self-pay | Admitting: *Deleted

## 2019-06-04 DIAGNOSIS — N6320 Unspecified lump in the left breast, unspecified quadrant: Secondary | ICD-10-CM

## 2019-06-04 DIAGNOSIS — R928 Other abnormal and inconclusive findings on diagnostic imaging of breast: Secondary | ICD-10-CM

## 2019-06-07 ENCOUNTER — Ambulatory Visit
Admission: RE | Admit: 2019-06-07 | Discharge: 2019-06-07 | Disposition: A | Discharge status: Home / Self Care | Payer: BC Managed Care – PPO | Source: Ambulatory Visit | Attending: *Deleted | Admitting: *Deleted

## 2019-06-07 DIAGNOSIS — N6320 Unspecified lump in the left breast, unspecified quadrant: Secondary | ICD-10-CM | POA: Diagnosis present

## 2019-06-07 DIAGNOSIS — R928 Other abnormal and inconclusive findings on diagnostic imaging of breast: Secondary | ICD-10-CM | POA: Diagnosis present

## 2019-06-13 ENCOUNTER — Other Ambulatory Visit: Payer: BC Managed Care – PPO

## 2019-06-13 ENCOUNTER — Ambulatory Visit: Payer: BC Managed Care – PPO

## 2019-07-05 DIAGNOSIS — F410 Panic disorder [episodic paroxysmal anxiety] without agoraphobia: Secondary | ICD-10-CM

## 2019-07-05 HISTORY — DX: Panic disorder (episodic paroxysmal anxiety): F41.0

## 2020-06-24 ENCOUNTER — Other Ambulatory Visit: Payer: Self-pay | Admitting: *Deleted

## 2020-06-24 DIAGNOSIS — Z1231 Encounter for screening mammogram for malignant neoplasm of breast: Secondary | ICD-10-CM

## 2020-06-25 DIAGNOSIS — J069 Acute upper respiratory infection, unspecified: Secondary | ICD-10-CM | POA: Diagnosis not present

## 2020-06-30 ENCOUNTER — Ambulatory Visit
Admission: RE | Admit: 2020-06-30 | Discharge: 2020-06-30 | Disposition: A | Discharge status: Home / Self Care | Payer: BC Managed Care – PPO | Source: Ambulatory Visit | Attending: *Deleted | Admitting: *Deleted

## 2020-06-30 ENCOUNTER — Other Ambulatory Visit: Payer: Self-pay

## 2020-06-30 DIAGNOSIS — Z1231 Encounter for screening mammogram for malignant neoplasm of breast: Secondary | ICD-10-CM | POA: Insufficient documentation

## 2020-07-21 DIAGNOSIS — S52531A Colles' fracture of right radius, initial encounter for closed fracture: Secondary | ICD-10-CM | POA: Diagnosis not present

## 2020-07-21 DIAGNOSIS — S52509A Unspecified fracture of the lower end of unspecified radius, initial encounter for closed fracture: Secondary | ICD-10-CM

## 2020-07-21 HISTORY — DX: Unspecified fracture of the lower end of unspecified radius, initial encounter for closed fracture: S52.509A

## 2020-07-22 ENCOUNTER — Other Ambulatory Visit: Payer: Self-pay | Admitting: *Deleted

## 2020-07-22 ENCOUNTER — Other Ambulatory Visit: Payer: Self-pay

## 2020-07-22 ENCOUNTER — Emergency Department: Payer: BC Managed Care – PPO

## 2020-07-22 ENCOUNTER — Encounter: Payer: Self-pay | Admitting: Radiology

## 2020-07-22 ENCOUNTER — Emergency Department
Admission: EM | Admit: 2020-07-22 | Discharge: 2020-07-22 | Disposition: A | Discharge status: Home / Self Care | Payer: BC Managed Care – PPO | Attending: Emergency Medicine | Admitting: Emergency Medicine

## 2020-07-22 DIAGNOSIS — G510 Bell's palsy: Secondary | ICD-10-CM

## 2020-07-22 DIAGNOSIS — S0231XA Fracture of orbital floor, right side, initial encounter for closed fracture: Secondary | ICD-10-CM

## 2020-07-22 DIAGNOSIS — W228XXA Striking against or struck by other objects, initial encounter: Secondary | ICD-10-CM | POA: Diagnosis not present

## 2020-07-22 DIAGNOSIS — S0083XA Contusion of other part of head, initial encounter: Secondary | ICD-10-CM | POA: Insufficient documentation

## 2020-07-22 DIAGNOSIS — G519 Disorder of facial nerve, unspecified: Secondary | ICD-10-CM | POA: Diagnosis not present

## 2020-07-22 DIAGNOSIS — S52531A Colles' fracture of right radius, initial encounter for closed fracture: Secondary | ICD-10-CM | POA: Diagnosis not present

## 2020-07-22 DIAGNOSIS — S0291XA Unspecified fracture of skull, initial encounter for closed fracture: Secondary | ICD-10-CM | POA: Diagnosis not present

## 2020-07-22 DIAGNOSIS — I10 Essential (primary) hypertension: Secondary | ICD-10-CM | POA: Insufficient documentation

## 2020-07-22 DIAGNOSIS — S0990XA Unspecified injury of head, initial encounter: Secondary | ICD-10-CM | POA: Diagnosis not present

## 2020-07-22 DIAGNOSIS — W19XXXA Unspecified fall, initial encounter: Secondary | ICD-10-CM

## 2020-07-22 DIAGNOSIS — M7989 Other specified soft tissue disorders: Secondary | ICD-10-CM | POA: Diagnosis not present

## 2020-07-22 DIAGNOSIS — I6523 Occlusion and stenosis of bilateral carotid arteries: Secondary | ICD-10-CM | POA: Diagnosis not present

## 2020-07-22 DIAGNOSIS — S0511XA Contusion of eyeball and orbital tissues, right eye, initial encounter: Secondary | ICD-10-CM | POA: Diagnosis not present

## 2020-07-22 DIAGNOSIS — Y93K1 Activity, walking an animal: Secondary | ICD-10-CM | POA: Diagnosis not present

## 2020-07-22 DIAGNOSIS — S0285XA Fracture of orbit, unspecified, initial encounter for closed fracture: Secondary | ICD-10-CM

## 2020-07-22 DIAGNOSIS — Z87891 Personal history of nicotine dependence: Secondary | ICD-10-CM | POA: Diagnosis not present

## 2020-07-22 DIAGNOSIS — M7981 Nontraumatic hematoma of soft tissue: Secondary | ICD-10-CM | POA: Diagnosis not present

## 2020-07-22 DIAGNOSIS — S199XXA Unspecified injury of neck, initial encounter: Secondary | ICD-10-CM | POA: Diagnosis not present

## 2020-07-22 DIAGNOSIS — Z79899 Other long term (current) drug therapy: Secondary | ICD-10-CM | POA: Insufficient documentation

## 2020-07-22 LAB — COMPREHENSIVE METABOLIC PANEL
ALT: 52 U/L — ABNORMAL HIGH (ref 0–44)
AST: 99 U/L — ABNORMAL HIGH (ref 15–41)
Albumin: 4.9 g/dL (ref 3.5–5.0)
Alkaline Phosphatase: 107 U/L (ref 38–126)
Anion gap: 8 (ref 5–15)
BUN: 9 mg/dL (ref 6–20)
CO2: 26 mmol/L (ref 22–32)
Calcium: 10.2 mg/dL (ref 8.9–10.3)
Chloride: 102 mmol/L (ref 98–111)
Creatinine, Ser: 0.67 mg/dL (ref 0.44–1.00)
GFR, Estimated: >60 mL/min (ref >60)
Glucose, Bld: 104 mg/dL — ABNORMAL HIGH (ref 70–99)
Potassium: 3.1 mmol/L — ABNORMAL LOW (ref 3.5–5.1)
Sodium: 136 mmol/L (ref 135–145)
Total Bilirubin: 1.3 mg/dL — ABNORMAL HIGH (ref 0.3–1.2)
Total Protein: 7.7 g/dL (ref 6.5–8.1)

## 2020-07-22 LAB — PROTIME-INR
INR: 1 (ref 0.8–1.2)
Prothrombin Time: 13.6 seconds (ref 11.4–15.2)

## 2020-07-22 LAB — CBC WITH DIFFERENTIAL/PLATELET
Abs Immature Granulocytes: 0.02 10*3/uL (ref 0.00–0.07)
Basophils Absolute: 0 10*3/uL (ref 0.0–0.1)
Basophils Relative: 1 %
Eosinophils Absolute: 0.2 10*3/uL (ref 0.0–0.5)
Eosinophils Relative: 3 %
HCT: 40.7 % (ref 36.0–46.0)
Hemoglobin: 14 g/dL (ref 12.0–15.0)
Immature Granulocytes: 0 %
Lymphocytes Relative: 23 %
Lymphs Abs: 1.5 10*3/uL (ref 0.7–4.0)
MCH: 33.9 pg (ref 26.0–34.0)
MCHC: 34.4 g/dL (ref 30.0–36.0)
MCV: 98.5 fL (ref 80.0–100.0)
Monocytes Absolute: 0.7 10*3/uL (ref 0.1–1.0)
Monocytes Relative: 10 %
Neutro Abs: 4.3 10*3/uL (ref 1.7–7.7)
Neutrophils Relative %: 63 %
Platelets: 168 10*3/uL (ref 150–400)
RBC: 4.13 MIL/uL (ref 3.87–5.11)
RDW: 11.9 % (ref 11.5–15.5)
WBC: 6.7 10*3/uL (ref 4.0–10.5)
nRBC: 0 % (ref 0.0–0.2)

## 2020-07-22 MED ORDER — POTASSIUM CHLORIDE CRYS ER 20 MEQ PO TBCR: 20.0000 meq | EXTENDED_RELEASE_TABLET | Freq: Every day | ORAL | 0 refills | Status: DC

## 2020-07-22 MED ORDER — POTASSIUM CHLORIDE CRYS ER 20 MEQ PO TBCR
40.0000 meq | EXTENDED_RELEASE_TABLET | Freq: Once | ORAL | Status: AC
  Administered 2020-07-22: 40 meq via ORAL
  Filled 2020-07-22: qty 2

## 2020-07-22 MED ORDER — IOHEXOL 350 MG/ML SOLN
75.0000 mL | Freq: Once | INTRAVENOUS | Status: AC | PRN
  Administered 2020-07-22: 20:00:00 75 mL via INTRAVENOUS
  Filled 2020-07-22: qty 75

## 2020-07-22 MED ORDER — MORPHINE SULFATE (PF) 4 MG/ML IV SOLN
4.0000 mg | Freq: Once | INTRAVENOUS | Status: AC
  Administered 2020-07-22: 20:00:00 4 mg via INTRAVENOUS
  Filled 2020-07-22: qty 1

## 2020-07-22 MED ORDER — LORAZEPAM 1 MG PO TABS
1.0000 mg | ORAL_TABLET | Freq: Once | ORAL | Status: AC
  Administered 2020-07-22: 21:00:00 1 mg via ORAL
  Filled 2020-07-22: qty 1

## 2020-07-22 MED ORDER — ONDANSETRON HCL 4 MG/2ML IJ SOLN
4.0000 mg | Freq: Once | INTRAMUSCULAR | Status: AC
  Administered 2020-07-22: 20:00:00 4 mg via INTRAVENOUS
  Filled 2020-07-22: qty 2

## 2020-07-22 NOTE — ED Provider Notes (Signed)
[facility] Emergency Department Provider Note  ____________________________________________  Time seen: Approximately 6:31 PM  I have reviewed the triage vital signs and the nursing notes.   HISTORY  Chief Complaint Fall    HPI Diane Hahn is a 50 y.o. female who presents the emergency department for evaluation for right-sided facial trauma, facial droop.  Patient had a mechanical fall while she was with her dog 2 days ago.  She struck the right side of her face, has a large hematoma important periorbital ecchymosis.  Patient also sustained a wrist injury and was evaluated orthopedics.  Today during the evaluation for presurgical planning, it was noted the patient had facial droop.  Patient and her husband had not noted this but they were referred to the emergency department for evaluation.  Patient denies any visual changes, she denies any unilateral weakness, numbness or tingling.  She has had some mild headache but no neck pain, chest pain, shortness of breath, abdominal pain, nausea or vomiting.  Patient's right arm is splinted at this time.  She does not wear glasses or contacts.  She denies any nystagmus or history of strabismus.  Medical history as described below.  She was sent to the emergency department for evaluation to ensure that patient was safe for surgery.       Past Medical History:  Diagnosis Date   HLD (hyperlipidemia)    Panic attack    Positive TB test     Patient Active Problem List   Diagnosis Date Noted   Other fatigue 07/15/2015   Chronic fatigue 08/27/2014   Thyroid dysfunction 07/17/2014   Hypertension 07/17/2014   Obesity (BMI 30-39.9) 04/15/2014   Menorrhagia with regular cycle 11/16/2013   Routine general medical examination at a health care facility 01/08/2013   Allergic rhinitis 12/10/2011   Anxiety 10/29/2011   Hyperlipidemia 02/04/2011   ABNORMAL EKG 07/11/2009    Past Surgical History:  Procedure Laterality Date    BREAST BIOPSY Right 2005   benign   BREAST REDUCTION SURGERY     CESAREAN SECTION  2009, 2012   DILATION AND CURETTAGE OF UTERUS  2006   ENDOMETRIAL ABLATION  2016   FOOT SURGERY     ovary and cyst removal     REDUCTION MAMMAPLASTY Bilateral 2002   TONSILLECTOMY     TUBAL LIGATION  05/25/10    Prior to Admission medications   Medication Sig Start Date End Date Taking? Authorizing Provider  potassium chloride (KLOR-CON) 20 MEQ tablet Take 1 tablet (20 mEq total) by mouth daily for 5 days. 07/22/20 07/27/20 Yes Robbie Nangle, Delorise Royals, PA-C  amLODipine (NORVASC) 5 MG tablet Take 1 tablet (5 mg total) by mouth daily. 04/28/16   Allegra Grana, FNP  desloratadine (CLARINEX) 5 MG tablet TAKE 1 TABLET BY MOUTH AS NEEDED. 04/28/16   Allegra Grana, FNP  diclofenac (VOLTAREN) 75 MG EC tablet Take 1 tablet (75 mg total) by mouth 2 (two) times daily. 10/17/15   Felecia Shelling, DPM  EPINEPHrine (EPIPEN 2-PAK) 0.3 mg/0.3 mL SOAJ injection Use as directed 01/08/13   Shelia Media, MD  escitalopram (LEXAPRO) 20 MG tablet TAKE 1 TABLET DAILY. MUST ESTABLISH WITH NEW PRIMARY CARE PHYSICIAN BEFORE FURTHER REFILLS 02/11/17   Allegra Grana, FNP  levothyroxine (SYNTHROID, LEVOTHROID) 75 MCG tablet Take 1 tablet (75 mcg total) by mouth daily. 11/13/15   Allegra Grana, FNP  NONFORMULARY OR COMPOUNDED ITEM Achilles Tendonitis Cream[facility] 2 refills    [provider]  rosuvastatin (CRESTOR) 5 MG tablet TAKE 1 TABLET AT BEDTIME. PATIENT NEEDS TO ESTABLISH CARE WITH NEW PRIMARY CARE PHYSICIAN 12/29/16   Allegra Grana, FNP    Allergies Codeine, Cephalexin, Isoniazid, and Penicillins  Family History  Problem Relation Age of Onset   Heart disease Mother 60       s/p stent   Hyperlipidemia Mother    Heart disease Father    Coronary artery disease Other        family hx; and of valvular disease   Sudden death Other        family hx   Diabetes Other        family hx    Hyperlipidemia Other        family hx   Hypertension Other        family hx   Cancer Other        family hx   Thyroid disease Other        family hx    Breast cancer Neg Hx     Social History Social History   Tobacco Use   Smoking status: Former    Pack years: 0.00   Tobacco comments:    quit in 2008  Substance Use Topics   Alcohol use: Yes    Comment: 1-2 glasses of wine with dinner    Drug use: No     Review of Systems  Constitutional: No fever/chills.  Positive for right-sided facial trauma Eyes: No visual changes. No discharge ENT: No upper respiratory complaints. Cardiovascular: no chest pain. Respiratory: no cough. No SOB. Gastrointestinal: No abdominal pain.  No nausea, no vomiting.  No diarrhea.  No constipation. Musculoskeletal: Positive for known right forearm fracture Skin: Negative for rash, abrasions, lacerations, ecchymosis. Neurological: Positive for headache and positive for right-sided facial droop.  Denies focal weakness or numbness.  10 System ROS otherwise negative.  ____________________________________________   PHYSICAL EXAM:  VITAL SIGNS: ED Triage Vitals  Enc Vitals Group     BP 07/22/20 1525 (!) 148/68     Pulse Rate 07/22/20 1525 71     Resp 07/22/20 1525 18     Temp 07/22/20 1525 98.5 F (36.9 C)     Temp src --      SpO2 07/22/20 1525 100 %     Weight 07/22/20 1657 211 lb 3.2 oz (95.8 kg)     Height 07/22/20 1657 5\' 5"  (1.651 m)     Head Circumference --      Peak Flow --      Pain Score 07/22/20 1524 0     Pain Loc --      Pain Edu? --      Excl. in GC? --      Constitutional: Alert and oriented. Well appearing and in no acute distress. Eyes: Conjunctivae are normal. PERRL.  Visualization of the bilateral eyes reveals right eye is deviated from midline.  Patient is focused with the left eye but the right eye has a lazy eye appearance.  No history of same.  During extraocular motion testing, patient does have full  extraocular motions but the right eye is lagging behind the left.  There is unequal pupils though they are both round and reactive to light.  This is patient's baseline however. Head: Patient with significant hematoma and ecchymosis about the right temporal and right orbital region.  There is no open wounds to this area.  Patient has significant tenderness throughout the area of ecchymosis  extending into the cheek and orbital region.  No palpable abnormality or crepitus.  There is no other significant visualized wounds to the face or head.  No other tenderness or palpable findings. ENT:      Ears:       Nose: No congestion/rhinnorhea.      Mouth/Throat: Mucous membranes are moist.  Neck: No stridor.  No cervical spine tenderness to palpation  Cardiovascular: Normal rate, regular rhythm. Normal S1 and S2.  Good peripheral circulation. Respiratory: Normal respiratory effort without tachypnea or retractions. Lungs CTAB. Good air entry to the bases with no decreased or absent breath sounds. Musculoskeletal: Full range of motion to all extremities. No gross deformities appreciated. Neurologic:  Normal speech and language. No gross focal neurologic deficits are appreciated.  Cranial nerves II through XII are intact.  As documented above, extraocular motion testing with cranial nerves reveals that the right eye does have some drift and is sluggish when compared with left.  However there is full range of motion at this time. Skin:  Skin is warm, dry and intact. No rash noted. Psychiatric: Mood and affect are normal. Speech and behavior are normal. Patient exhibits appropriate insight and judgement.   ____________________________________________   LABS (all labs ordered are listed, but only abnormal results are displayed)  Labs Reviewed  COMPREHENSIVE METABOLIC PANEL - Abnormal; Notable for the following components:      Result Value   Potassium 3.1 (*)    Glucose, Bld 104 (*)    AST 99 (*)    ALT  52 (*)    Total Bilirubin 1.3 (*)    All other components within normal limits  CBC WITH DIFFERENTIAL/PLATELET  PROTIME-INR   ____________________________________________  EKG   ____________________________________________  RADIOLOGY I personally viewed and evaluated these images as part of my medical decision making, as well as reviewing the written report by the radiologist.  ED Provider Interpretation: CT orbits revealed orbital fractures to the orbital floor and wall.  No evidence of displacement or entrapment.  CT angio revealed no acute intracranial abnormality or abnormality in the vasculature of the neck.  MRI brain is reassuring without acute findings as well.  CT ANGIO HEAD NECK W WO CM  Result Date: 07/22/2020 CLINICAL DATA:  Fall, right orbit injury EXAM: CT ANGIOGRAPHY HEAD AND NECK TECHNIQUE: Multidetector CT imaging of the head and neck was performed using the standard protocol during bolus administration of intravenous contrast. Multiplanar CT image reconstructions and MIPs were obtained to evaluate the vascular anatomy. Carotid stenosis measurements (when applicable) are obtained utilizing NASCET criteria, using the distal internal carotid diameter as the denominator. CONTRAST:  75mL OMNIPAQUE IOHEXOL 350 MG/ML SOLN COMPARISON:  None. FINDINGS: CT HEAD Brain: There is no acute intracranial hemorrhage, mass effect, or edema. Gray-white differentiation is preserved. There is no extra-axial fluid collection. Ventricles and sulci are within normal limits in size and configuration. Vascular: Better evaluated on CTA portion. Skull: Calvarium is unremarkable. Sinuses/Orbits: Right periorbital hematoma. Right orbital fractures are previously described. Probable blood within the right maxillary sinus. Other: Mastoid air cells are clear. Review of the MIP images confirms the above findings CTA NECK Aortic arch: Great vessel origins are patent. Right carotid system: Patent. Trace calcified  plaque at the common carotid bifurcation. No stenosis or evidence of dissection. Left carotid system: Patent. Mild calcified plaque at the common carotid bifurcation. No stenosis or evidence of dissection. Vertebral arteries: Patent. Proximal left vertebral artery is partially obscured by artifact from venous contrast. No  stenosis or evidence of dissection. Skeleton: No acute abnormality. Other neck: Right periorbital and adjacent facial soft tissue swelling and hematoma no evidence of active contrast extravasation or pseudoaneurysm. Upper chest: Included upper lungs are clear. Review of the MIP images confirms the above findings CTA HEAD Anterior circulation: Intracranial internal carotid arteries are patent with minor calcified plaque. Anterior and middle cerebral arteries are patent. Posterior circulation: Intracranial vertebral arteries, basilar artery, and posterior cerebral arteries are patent. Venous sinuses: Patent as allowed by contrast bolus timing. Review of the MIP images confirms the above findings IMPRESSION: No acute intracranial abnormality. No evidence of arterial injury. Right orbital findings described on prior dedicated imaging. Electronically Signed   By: Guadlupe Spanish M.D.   On: 07/22/2020 20:12   MR BRAIN WO CONTRAST  Result Date: 07/22/2020 CLINICAL DATA:  Head trauma EXAM: MRI HEAD WITHOUT CONTRAST TECHNIQUE: Multiplanar, multiecho pulse sequences of the brain and surrounding structures were obtained without intravenous contrast. COMPARISON:  None. FINDINGS: Brain: No acute infarct, mass effect or extra-axial collection. No acute or chronic hemorrhage. Normal white matter signal, parenchymal volume and CSF spaces. The midline structures are normal. Vascular: Major flow voids are preserved. Skull and upper cervical spine: Orbital fracture is better evaluated on earlier dedicated imaging. Large amount of right periorbital soft tissue swelling. Sinuses/Orbits:No paranasal sinus fluid  levels or advanced mucosal thickening. No mastoid or middle ear effusion. Normal orbits. IMPRESSION: 1. Normal MRI of the brain. 2. Large amount of right periorbital soft tissue swelling. Electronically Signed   By: Deatra Robinson M.D.   On: 07/22/2020 21:22   CT Orbits Wo Contrast  Result Date: 07/22/2020 CLINICAL DATA:  Right orbital trauma.  Fall while walking dog. EXAM: CT ORBITS WITHOUT CONTRAST TECHNIQUE: Multidetector CT imaging of the orbits was performed using the standard protocol without intravenous contrast. Multiplanar CT image reconstructions were also generated. COMPARISON:  No pertinent prior exam. FINDINGS: Orbits: Mildly displaced fractures of the right orbital floor and right lamina papyracea. Slight buckling of the lateral wall of the right orbit likely reflecting a nondisplaced fracture. No retrobulbar hematoma or herniation of orbital contents. Moderately large right periorbital/preseptal hematoma with contusion/swelling extending posterolaterally and inferiorly in the right face. Visible paranasal sinuses: Partial opacification of multiple right ethmoid air cells, likely by blood. Small maxillary sinuses with the right maxillary sinus being opacified. Paradoxical rotation of the right middle turbinate. Clear mastoid air cells. Soft tissues: Hematoma as described above. Osseous: Right orbital fractures as described above. Limited intracranial: Unremarkable. IMPRESSION: 1. Mildly displaced right orbital fractures as detailed above. 2. Moderately large right periorbital hematoma. No retro bulbar hematoma. Electronically Signed   By: Sebastian Ache M.D.   On: 07/22/2020 15:50    ____________________________________________    PROCEDURES  Procedure(s) performed:    Procedures    Medications  morphine 4 MG/ML injection 4 mg (4 mg Intravenous Given 07/22/20 1939)  ondansetron (ZOFRAN) injection 4 mg (4 mg Intravenous Given 07/22/20 1939)  iohexol (OMNIPAQUE) 350 MG/ML injection 75 mL  (75 mLs Intravenous Contrast Given 07/22/20 1952)  LORazepam (ATIVAN) tablet 1 mg (1 mg Oral Given 07/22/20 2030)  potassium chloride SA (KLOR-CON) CR tablet 40 mEq (40 mEq Oral Given 07/22/20 2243)     ____________________________________________   INITIAL IMPRESSION / ASSESSMENT AND PLAN / ED COURSE  Pertinent labs & imaging results that were available during my care of the patient were reviewed by me and considered in my medical decision making (see chart for details).  Review  of the [place] CSRS was performed in accordance of the [facility] prior to dispensing any controlled drugs.           Patient's diagnosis is consistent with fall, contusion of the face, facial nerve palsy.  Patient presented to the emergency department from orthopedics for evaluation of facial droop and facial trauma.  Patient had a fall 3 days ago, injured her face and her wrist.  Orthopedics has been managing the patient's wrist and today it was noted that she had facial droop.  She was sent to the emergency department for evaluation.  The patient and her husband had not noticed the droop prior to this being addressed in orthopedics.  This is new in regards to the patient's injury.  Patient had some vague intermittent headache which she had attributed to the trauma but was largely only concerned about the pain in her wrist.  Given the impressive ecchymosis and hematoma to the face CT orbits had been ordered by protocol on arrival.  This revealed fractures of the orbit without evidence of entrapment.  During cranial nerve testing, cranial nerves were grossly intact but it appeared that patient had ocular drift of the right eye.  She has no history of nystagmus, true strabismus, or lazy eye.  The right eye would track while the left eye.  Focus.  She denied any kind of visual changes as well.  Given the facial droop, the changes in ocular motion I was concern for underlying injury specifically central nervous system injury.  I  discussed the patient with radiologist and with neurology to ensure we performed appropriate advanced imaging on the patient.  It was recommended to do an MRI brain and CT angio head and neck.  These thankfully returned with reassuring results.  I had repeated patient's exam multiple times tonight and patient had improvement in the ocular drip.  At time of discharge patient's eyes are equal, round, reactive to light and accommodation.  Extraocular motions intact.  Patient no longer has strabismus or lazy eye.  At this time with reassuring work-up including advanced imaging, and the patient's symptoms improving I do not feel as inclined to believe that this was a central neuro deficit.  Patient has ongoing facial droop which I feel is likely facial nerve palsy secondary to trauma.  I am hopeful that this will resolve once large hematoma and areas of ecchymosis to the face overlying the facial and trigeminal nerves improved.  Patient should follow-up with neurology for same.  At this time patient is taking Tylenol and Motrin for her wrist pain and this is splinted.  This is already being managed by orthopedics and she will be scheduled for surgery.  This time patient is given strict return precautions for any new or worsening symptoms in regards to facial droop, eyedrops, or other concerning signs such as headache, unilateral weakness, difficulty formulating thoughts or words.  The patient and her husband are agreeable with this plan.  Patient is stable for discharge at this time.  Again return to the emergency department for any new or concerning signs and symptoms.    ____________________________________________  FINAL CLINICAL IMPRESSION(S) / ED DIAGNOSES  Final diagnoses:  Fall, initial encounter  Contusion of face, initial encounter  Facial nerve palsy  Closed fracture of orbit, initial encounter (HCC)      NEW MEDICATIONS STARTED DURING THIS VISIT:  ED Discharge Orders          Ordered     potassium chloride (KLOR-CON) 20 MEQ  tablet  Daily        07/22/20 2241                This chart was dictated using voice recognition software/Dragon. Despite best efforts to proofread, errors can occur which can change the meaning. Any change was purely unintentional.    Racheal PatchesCuthriell, Tarrell Debes D, PA-C 07/23/20 0049    Chesley NoonJessup, Charles, MD 07/24/20 [phone]

## 2020-07-22 NOTE — ED Notes (Signed)
See triage note. Pt presents with hematoma to right eye. Pt stating sustained injury while walking dog. Pt with wrist splint in place from [facility].

## 2020-07-22 NOTE — ED Triage Notes (Signed)
Pt comes with c/o fall while walking her dog.  Pt has black eye and swollen to right side. Pt was seen at [facility] for her right arm but not checked out for her eye.  Pt brought over from [facility].

## 2020-07-24 DIAGNOSIS — S0231XA Fracture of orbital floor, right side, initial encounter for closed fracture: Secondary | ICD-10-CM | POA: Diagnosis not present

## 2020-07-29 DIAGNOSIS — S52531A Colles' fracture of right radius, initial encounter for closed fracture: Secondary | ICD-10-CM | POA: Diagnosis not present

## 2020-08-01 DIAGNOSIS — G8918 Other acute postprocedural pain: Secondary | ICD-10-CM | POA: Diagnosis not present

## 2020-08-01 DIAGNOSIS — E785 Hyperlipidemia, unspecified: Secondary | ICD-10-CM | POA: Diagnosis not present

## 2020-08-01 DIAGNOSIS — F17210 Nicotine dependence, cigarettes, uncomplicated: Secondary | ICD-10-CM | POA: Diagnosis not present

## 2020-08-01 DIAGNOSIS — X58XXXA Exposure to other specified factors, initial encounter: Secondary | ICD-10-CM | POA: Diagnosis not present

## 2020-08-01 DIAGNOSIS — S52531A Colles' fracture of right radius, initial encounter for closed fracture: Secondary | ICD-10-CM | POA: Diagnosis not present

## 2020-08-01 DIAGNOSIS — E039 Hypothyroidism, unspecified: Secondary | ICD-10-CM | POA: Diagnosis not present

## 2020-08-01 DIAGNOSIS — Z880 Allergy status to penicillin: Secondary | ICD-10-CM | POA: Diagnosis not present

## 2020-08-01 DIAGNOSIS — Z881 Allergy status to other antibiotic agents status: Secondary | ICD-10-CM | POA: Diagnosis not present

## 2020-08-01 DIAGNOSIS — M79631 Pain in right forearm: Secondary | ICD-10-CM | POA: Diagnosis not present

## 2020-08-01 DIAGNOSIS — S52571A Other intraarticular fracture of lower end of right radius, initial encounter for closed fracture: Secondary | ICD-10-CM | POA: Diagnosis not present

## 2020-08-01 DIAGNOSIS — F32A Depression, unspecified: Secondary | ICD-10-CM | POA: Diagnosis not present

## 2020-08-01 DIAGNOSIS — Z91018 Allergy to other foods: Secondary | ICD-10-CM | POA: Diagnosis not present

## 2020-08-01 DIAGNOSIS — I10 Essential (primary) hypertension: Secondary | ICD-10-CM | POA: Diagnosis not present

## 2020-08-07 DIAGNOSIS — S52531A Colles' fracture of right radius, initial encounter for closed fracture: Secondary | ICD-10-CM | POA: Diagnosis not present

## 2020-08-14 DIAGNOSIS — S52531A Colles' fracture of right radius, initial encounter for closed fracture: Secondary | ICD-10-CM | POA: Diagnosis not present

## 2020-08-14 DIAGNOSIS — Z4889 Encounter for other specified surgical aftercare: Secondary | ICD-10-CM | POA: Diagnosis not present

## 2020-08-22 DIAGNOSIS — S52501D Unspecified fracture of the lower end of right radius, subsequent encounter for closed fracture with routine healing: Secondary | ICD-10-CM | POA: Diagnosis not present

## 2020-08-28 DIAGNOSIS — S52501D Unspecified fracture of the lower end of right radius, subsequent encounter for closed fracture with routine healing: Secondary | ICD-10-CM | POA: Diagnosis not present

## 2020-09-09 DIAGNOSIS — S52501D Unspecified fracture of the lower end of right radius, subsequent encounter for closed fracture with routine healing: Secondary | ICD-10-CM | POA: Diagnosis not present

## 2020-09-16 DIAGNOSIS — S52501D Unspecified fracture of the lower end of right radius, subsequent encounter for closed fracture with routine healing: Secondary | ICD-10-CM | POA: Diagnosis not present

## 2020-09-23 DIAGNOSIS — S52501D Unspecified fracture of the lower end of right radius, subsequent encounter for closed fracture with routine healing: Secondary | ICD-10-CM | POA: Diagnosis not present

## 2020-10-06 DIAGNOSIS — S52501D Unspecified fracture of the lower end of right radius, subsequent encounter for closed fracture with routine healing: Secondary | ICD-10-CM | POA: Diagnosis not present

## 2020-10-09 DIAGNOSIS — Z0001 Encounter for general adult medical examination with abnormal findings: Secondary | ICD-10-CM | POA: Diagnosis not present

## 2020-10-09 DIAGNOSIS — E063 Autoimmune thyroiditis: Secondary | ICD-10-CM | POA: Diagnosis not present

## 2020-10-09 DIAGNOSIS — S62101A Fracture of unspecified carpal bone, right wrist, initial encounter for closed fracture: Secondary | ICD-10-CM | POA: Insufficient documentation

## 2020-10-09 DIAGNOSIS — Z124 Encounter for screening for malignant neoplasm of cervix: Secondary | ICD-10-CM | POA: Diagnosis not present

## 2020-10-09 DIAGNOSIS — S0285XA Fracture of orbit, unspecified, initial encounter for closed fracture: Secondary | ICD-10-CM

## 2020-10-09 DIAGNOSIS — F341 Dysthymic disorder: Secondary | ICD-10-CM | POA: Diagnosis not present

## 2020-10-09 HISTORY — DX: Fracture of unspecified carpal bone, right wrist, initial encounter for closed fracture: S62.101A

## 2020-10-09 HISTORY — DX: Fracture of orbit, unspecified, initial encounter for closed fracture: S02.85XA

## 2020-10-15 DIAGNOSIS — S52501D Unspecified fracture of the lower end of right radius, subsequent encounter for closed fracture with routine healing: Secondary | ICD-10-CM | POA: Diagnosis not present

## 2020-10-31 DIAGNOSIS — S52501D Unspecified fracture of the lower end of right radius, subsequent encounter for closed fracture with routine healing: Secondary | ICD-10-CM | POA: Diagnosis not present

## 2020-11-13 DIAGNOSIS — S52501D Unspecified fracture of the lower end of right radius, subsequent encounter for closed fracture with routine healing: Secondary | ICD-10-CM | POA: Diagnosis not present

## 2020-11-25 DIAGNOSIS — S52501D Unspecified fracture of the lower end of right radius, subsequent encounter for closed fracture with routine healing: Secondary | ICD-10-CM | POA: Diagnosis not present

## 2020-12-09 DIAGNOSIS — S52501D Unspecified fracture of the lower end of right radius, subsequent encounter for closed fracture with routine healing: Secondary | ICD-10-CM | POA: Diagnosis not present

## 2020-12-16 DIAGNOSIS — S52501D Unspecified fracture of the lower end of right radius, subsequent encounter for closed fracture with routine healing: Secondary | ICD-10-CM | POA: Diagnosis not present

## 2021-01-05 DIAGNOSIS — R6889 Other general symptoms and signs: Secondary | ICD-10-CM | POA: Diagnosis not present

## 2021-01-05 DIAGNOSIS — Z03818 Encounter for observation for suspected exposure to other biological agents ruled out: Secondary | ICD-10-CM | POA: Diagnosis not present

## 2021-02-26 DIAGNOSIS — U071 COVID-19: Secondary | ICD-10-CM | POA: Diagnosis not present

## 2021-02-26 DIAGNOSIS — M7918 Myalgia, other site: Secondary | ICD-10-CM | POA: Diagnosis not present

## 2021-02-26 DIAGNOSIS — R5383 Other fatigue: Secondary | ICD-10-CM | POA: Diagnosis not present

## 2021-06-22 DIAGNOSIS — A0811 Acute gastroenteropathy due to Norwalk agent: Secondary | ICD-10-CM | POA: Insufficient documentation

## 2021-06-22 DIAGNOSIS — K30 Functional dyspepsia: Secondary | ICD-10-CM | POA: Diagnosis not present

## 2021-06-22 HISTORY — DX: Acute gastroenteropathy due to Norwalk agent: A08.11

## 2021-08-04 ENCOUNTER — Other Ambulatory Visit: Payer: Self-pay | Admitting: *Deleted

## 2021-08-04 DIAGNOSIS — Z1231 Encounter for screening mammogram for malignant neoplasm of breast: Secondary | ICD-10-CM

## 2021-08-26 ENCOUNTER — Ambulatory Visit
Admission: RE | Admit: 2021-08-26 | Discharge: 2021-08-26 | Disposition: A | Discharge status: Home / Self Care | Payer: BC Managed Care – PPO | Source: Ambulatory Visit | Attending: *Deleted | Admitting: *Deleted

## 2021-08-26 DIAGNOSIS — Z1231 Encounter for screening mammogram for malignant neoplasm of breast: Secondary | ICD-10-CM | POA: Diagnosis not present

## 2021-09-08 DIAGNOSIS — H2513 Age-related nuclear cataract, bilateral: Secondary | ICD-10-CM | POA: Diagnosis not present

## 2021-10-09 DIAGNOSIS — F4323 Adjustment disorder with mixed anxiety and depressed mood: Secondary | ICD-10-CM | POA: Diagnosis not present

## 2021-10-09 DIAGNOSIS — Z561 Change of job: Secondary | ICD-10-CM | POA: Diagnosis not present

## 2021-10-15 DIAGNOSIS — U071 COVID-19: Secondary | ICD-10-CM | POA: Diagnosis not present

## 2021-10-23 DIAGNOSIS — F4323 Adjustment disorder with mixed anxiety and depressed mood: Secondary | ICD-10-CM | POA: Diagnosis not present

## 2021-10-23 DIAGNOSIS — Z561 Change of job: Secondary | ICD-10-CM | POA: Diagnosis not present

## 2021-11-13 DIAGNOSIS — Z561 Change of job: Secondary | ICD-10-CM | POA: Diagnosis not present

## 2021-11-13 DIAGNOSIS — F4323 Adjustment disorder with mixed anxiety and depressed mood: Secondary | ICD-10-CM | POA: Diagnosis not present

## 2021-11-24 NOTE — Progress Notes (Deleted)
I,[name],acting as a Education administrator for [employer], MD.,have documented all relevant documentation on the behalf of Diane Foster, MD,as directed by  Diane Foster, MD while in the presence of Diane Foster, MD.   New patient visit   Patient: Diane Hahn   DOB: 06-23-1970   51 y.o. Female  MRN: [id number] Visit Date: 11/25/2021  Today's healthcare provider: [name], MD   No chief complaint on file.  Subjective    Diane Hahn is a 51 y.o. female who presents today as a new patient to establish care.  HPI  ***  Past Medical History:  Diagnosis Date   HLD (hyperlipidemia)    Panic attack    Positive TB test    Past Surgical History:  Procedure Laterality Date   BREAST BIOPSY Right 2005   benign   BREAST REDUCTION SURGERY     CESAREAN SECTION  2009, 2012   DILATION AND CURETTAGE OF UTERUS  2006   ENDOMETRIAL ABLATION  2016   FOOT SURGERY     ovary and cyst removal     REDUCTION MAMMAPLASTY Bilateral 2002   TONSILLECTOMY     TUBAL LIGATION  05/25/10   Family Status  Relation Name Status   Mother  Alive   Father  Deceased   Other  (Not Specified)   Other  (Not Specified)   Other  (Not Specified)   Other  (Not Specified)   Other  (Not Specified)   Other  (Not Specified)   Other  (Not Specified)   Neg Hx  (Not Specified)   Family History  Problem Relation Age of Onset   Heart disease Mother 45       s/p stent   Hyperlipidemia Mother    Heart disease Father    Coronary artery disease Other        family hx; and of valvular disease   Sudden death Other        family hx   Diabetes Other        family hx   Hyperlipidemia Other        family hx   Hypertension Other        family hx   Cancer Other        family hx   Thyroid disease Other        family hx    Breast cancer Neg Hx    Social History   Socioeconomic History   Marital status: Married    Spouse name: Not on file   Number  of children: Not on file   Years of education: Not on file   Highest education level: Not on file  Occupational History   Not on file  Tobacco Use   Smoking status: Former   Smokeless tobacco: Not on file   Tobacco comments:    quit in 2008  Substance and Sexual Activity   Alcohol use: Yes    Comment: 1-2 glasses of wine with dinner    Drug use: No   Sexual activity: Not on file  Other Topics Concern   Not on file  Social History Narrative   Married; full time Education officer, museum.    Social Determinants of Health   Financial Resource Strain: Not on file  Food Insecurity: Not on file  Transportation Needs: Not on file  Physical Activity: Not on file  Stress: Not on file  Social Connections: Not on file   Outpatient Medications Prior to Visit  Medication Sig  amLODipine (NORVASC) 5 MG tablet Take 1 tablet (5 mg total) by mouth daily.   desloratadine (CLARINEX) 5 MG tablet TAKE 1 TABLET BY MOUTH AS NEEDED.   diclofenac (VOLTAREN) 75 MG EC tablet Take 1 tablet (75 mg total) by mouth 2 (two) times daily.   EPINEPHrine (EPIPEN 2-PAK) 0.3 mg/0.3 mL SOAJ injection Use as directed   escitalopram (LEXAPRO) 20 MG tablet TAKE 1 TABLET DAILY. MUST ESTABLISH WITH NEW PRIMARY CARE PHYSICIAN BEFORE FURTHER REFILLS   levothyroxine (SYNTHROID, LEVOTHROID) 75 MCG tablet Take 1 tablet (75 mcg total) by mouth daily.   NONFORMULARY OR COMPOUNDED ITEM Achilles Tendonitis Cream-[facility] 2 refills   potassium chloride (KLOR-CON) 20 MEQ tablet Take 1 tablet (20 mEq total) by mouth daily for 5 days.   rosuvastatin (CRESTOR) 5 MG tablet TAKE 1 TABLET AT BEDTIME. PATIENT NEEDS TO ESTABLISH CARE WITH NEW PRIMARY CARE PHYSICIAN   Facility-Administered Medications Prior to Visit  Medication Dose Route Frequency Provider   betamethasone acetate-betamethasone sodium phosphate (CELESTONE) injection 12 mg  12 mg Intramuscular Once Felecia Shelling, DPM   Allergies  Allergen Reactions   Codeine  Shortness Of Breath   Cephalexin    Isoniazid Hives   Penicillins     Immunization History  Administered Date(s) Administered   Influenza Split 01/05/2011, 10/29/2011   Influenza,inj,Quad PF,6+ Mos 01/08/2013   Tdap 05/27/2010    Health Maintenance  Topic Date Due   COVID-19 Vaccine (1) Never done   HIV Screening  Never done   COLONOSCOPY (Pts 45-76yrs Insurance coverage will need to be confirmed)  Never done   PAP SMEAR-Modifier  04/14/2017   TETANUS/TDAP  05/26/2020   Zoster Vaccines- Shingrix (1 of 2) Never done   INFLUENZA VACCINE  09/08/2021   MAMMOGRAM  08/27/2023   Hepatitis C Screening  Completed   HPV VACCINES  Aged Out    Patient Care Team: Pieter Partridge., MD as PCP - General  Review of Systems  {Labs  Heme  Chem  Endocrine  Serology  Results Review (optional):23779}   Objective    There were no vitals taken for this visit. {Show previous vital signs (optional):23777}  Physical Exam ***  Depression Screen    07/15/2015   11:25 AM  PHQ 2/9 Scores  PHQ - 2 Score 0   No results found for any visits on 11/25/21.  Assessment & Plan     ***  No follow-ups on file.     {provider attestation***:1}   Ronnald Ramp, MD  [facility] [phone] (phone) [phone] (fax)  [facility]

## 2021-11-25 ENCOUNTER — Ambulatory Visit: Payer: Self-pay | Admitting: Family Medicine

## 2021-11-27 DIAGNOSIS — Z561 Change of job: Secondary | ICD-10-CM | POA: Diagnosis not present

## 2021-11-27 DIAGNOSIS — F4323 Adjustment disorder with mixed anxiety and depressed mood: Secondary | ICD-10-CM | POA: Diagnosis not present

## 2021-12-24 DIAGNOSIS — F4323 Adjustment disorder with mixed anxiety and depressed mood: Secondary | ICD-10-CM | POA: Diagnosis not present

## 2021-12-24 DIAGNOSIS — Z561 Change of job: Secondary | ICD-10-CM | POA: Diagnosis not present

## 2022-02-11 DIAGNOSIS — F4323 Adjustment disorder with mixed anxiety and depressed mood: Secondary | ICD-10-CM | POA: Diagnosis not present

## 2022-02-11 DIAGNOSIS — Z561 Change of job: Secondary | ICD-10-CM | POA: Diagnosis not present

## 2022-03-17 NOTE — Progress Notes (Unsigned)
New patient visit   Patient: Diane Hahn   DOB: 06-Jan-1971   52 y.o. Female  MRN: [id number] Visit Date: 03/18/2022  Today's healthcare provider: [name], MD   No chief complaint on file.  Subjective    Diane Hahn is a 52 y.o. female who presents today as a new patient to establish care.  HPI   Encounter to Establish Care  ***   Past Medical History:  Diagnosis Date   HLD (hyperlipidemia)    Panic attack    Positive TB test    Past Surgical History:  Procedure Laterality Date   BREAST BIOPSY Right 2005   benign   BREAST REDUCTION SURGERY     CESAREAN SECTION  2009, 2012   DILATION AND CURETTAGE OF UTERUS  2006   ENDOMETRIAL ABLATION  2016   FOOT SURGERY     ovary and cyst removal     REDUCTION MAMMAPLASTY Bilateral 2002   TONSILLECTOMY     TUBAL LIGATION  05/25/10   Family Status  Relation Name Status   Mother  Alive   Father  Deceased   Other  (Not Specified)   Other  (Not Specified)   Other  (Not Specified)   Other  (Not Specified)   Other  (Not Specified)   Other  (Not Specified)   Other  (Not Specified)   Neg Hx  (Not Specified)   Family History  Problem Relation Age of Onset   Heart disease Mother 1       s/p stent   Hyperlipidemia Mother    Heart disease Father    Coronary artery disease Other        family hx; and of valvular disease   Sudden death Other        family hx   Diabetes Other        family hx   Hyperlipidemia Other        family hx   Hypertension Other        family hx   Cancer Other        family hx   Thyroid disease Other        family hx    Breast cancer Neg Hx    Social History   Socioeconomic History   Marital status: Married    Spouse name: Not on file   Number of children: Not on file   Years of education: Not on file   Highest education level: Not on file  Occupational History   Not on file  Tobacco Use   Smoking status: Former   Smokeless tobacco: Not on file   Tobacco comments:     quit in 2008  Substance and Sexual Activity   Alcohol use: Yes    Comment: 1-2 glasses of wine with dinner    Drug use: No   Sexual activity: Not on file  Other Topics Concern   Not on file  Social History Narrative   Married; full time Education officer, museum.    Social Determinants of Health   Financial Resource Strain: Not on file  Food Insecurity: Not on file  Transportation Needs: Not on file  Physical Activity: Not on file  Stress: Not on file  Social Connections: Not on file   Outpatient Medications Prior to Visit  Medication Sig   amLODipine (NORVASC) 5 MG tablet Take 1 tablet (5 mg total) by mouth daily.   desloratadine (CLARINEX) 5 MG tablet TAKE 1 TABLET BY MOUTH AS NEEDED.  diclofenac (VOLTAREN) 75 MG EC tablet Take 1 tablet (75 mg total) by mouth 2 (two) times daily.   EPINEPHrine (EPIPEN 2-PAK) 0.3 mg/0.3 mL SOAJ injection Use as directed   escitalopram (LEXAPRO) 20 MG tablet TAKE 1 TABLET DAILY. MUST ESTABLISH WITH NEW PRIMARY CARE PHYSICIAN BEFORE FURTHER REFILLS   levothyroxine (SYNTHROID, LEVOTHROID) 75 MCG tablet Take 1 tablet (75 mcg total) by mouth daily.   NONFORMULARY OR COMPOUNDED ITEM Achilles Tendonitis Cream-[facility] 2 refills   potassium chloride (KLOR-CON) 20 MEQ tablet Take 1 tablet (20 mEq total) by mouth daily for 5 days.   rosuvastatin (CRESTOR) 5 MG tablet TAKE 1 TABLET AT BEDTIME. PATIENT NEEDS TO ESTABLISH CARE WITH NEW PRIMARY CARE PHYSICIAN   Facility-Administered Medications Prior to Visit  Medication Dose Route Frequency Provider   betamethasone acetate-betamethasone sodium phosphate (CELESTONE) injection 12 mg  12 mg Intramuscular Once Edrick Kins, DPM   Allergies  Allergen Reactions   Codeine Shortness Of Breath   Cephalexin    Isoniazid Hives   Penicillins     Immunization History  Administered Date(s) Administered   Influenza Split 01/05/2011, 10/29/2011   Influenza,inj,Quad PF,6+ Mos 01/08/2013   Tdap 05/27/2010     Health Maintenance  Topic Date Due   COVID-19 Vaccine (1) Never done   HIV Screening  Never done   COLONOSCOPY (Pts 45-14yrs Insurance coverage will need to be confirmed)  Never done   PAP SMEAR-Modifier  04/14/2017   DTaP/Tdap/Td (2 - Td or Tdap) 05/26/2020   Zoster Vaccines- Shingrix (1 of 2) Never done   INFLUENZA VACCINE  09/08/2021   MAMMOGRAM  08/27/2023   Hepatitis C Screening  Completed   HPV VACCINES  Aged Out    Patient Care Team: Fabian November., MD as PCP - General  Review of Systems  {Labs  Heme  Chem  Endocrine  Serology  Results Review (optional):23779}   Objective    There were no vitals taken for this visit. {Show previous vital signs (optional):23777}  Physical Exam ***  Depression Screen    07/15/2015   11:25 AM  PHQ 2/9 Scores  PHQ - 2 Score 0   No results found for any visits on 03/18/22.  Assessment & Plan      Problem List Items Addressed This Visit   None    No follow-ups on file.     {provider attestation***:1}  The entirety of the information documented in the History of Present Illness, Review of Systems and Physical Exam were personally obtained by me. Portions of this information were initially documented by *** and reviewed by me for thoroughness and accuracy.[name], MD     [name], MD  [facility] [phone] (phone) [phone] (fax)  [place]

## 2022-03-18 ENCOUNTER — Ambulatory Visit: Payer: BC Managed Care – PPO | Admitting: Family Medicine

## 2022-03-18 ENCOUNTER — Encounter: Payer: Self-pay | Admitting: Family Medicine

## 2022-03-18 VITALS — BP 129/83 | HR 105 | Resp 16 | Ht 66.0 in | Wt 152.0 lb

## 2022-03-18 DIAGNOSIS — Z23 Encounter for immunization: Secondary | ICD-10-CM | POA: Diagnosis not present

## 2022-03-18 DIAGNOSIS — Z7689 Persons encountering health services in other specified circumstances: Secondary | ICD-10-CM | POA: Insufficient documentation

## 2022-03-18 DIAGNOSIS — B354 Tinea corporis: Secondary | ICD-10-CM | POA: Diagnosis not present

## 2022-03-18 DIAGNOSIS — L309 Dermatitis, unspecified: Secondary | ICD-10-CM | POA: Insufficient documentation

## 2022-03-18 MED ORDER — CLOTRIMAZOLE 1 % EX CREA: 1.0000 | TOPICAL_CREAM | Freq: Two times a day (BID) | CUTANEOUS | 0 refills | Status: AC

## 2022-03-18 NOTE — Assessment & Plan Note (Signed)
Acute problem  Patient is stable  Rash has changed since it started but is no longer enlarging  Appearance consistent with tinea  Will treat with topical clotrimazole 1% AF cream to be applied twice daily for 4 weeks  Patient will follow up in next few weeks for physical and can check on status of rash at that time

## 2022-03-18 NOTE — Assessment & Plan Note (Addendum)
Patient received second dose of shingles vaccine today  She was counseled on flu like symptoms that can last 2-3 days after shingles vaccine  Patient voiced understanding

## 2022-03-18 NOTE — Assessment & Plan Note (Signed)
Welcomed patient to practice Reviewed patient's medical history Reviewed patient's medications Reviewed patient surgical and social history Discussed roles and expectations primary care physician-patient relationship    

## 2022-03-18 NOTE — Patient Instructions (Addendum)
It was a pleasure to see you today!  Thank you for choosing [facility] for your primary care.   Diane Hahn was seen for establishing care.   Our plans for today were: Tinea Corporis: I will prescribe a topical for you to apply for 4 weeks twice daily.  Please schedule an appointment in two weeks for your physical. I recommend fasting 8 hours before blood work.  Today you received your Shingles vaccine.   You should return to our clinic in 2 weeks for physical.   I look forward to taking part in your healthcare!  Dr. Quentin Cornwall

## 2022-03-25 DIAGNOSIS — F4323 Adjustment disorder with mixed anxiety and depressed mood: Secondary | ICD-10-CM | POA: Diagnosis not present

## 2022-03-25 DIAGNOSIS — Z561 Change of job: Secondary | ICD-10-CM | POA: Diagnosis not present

## 2022-03-31 NOTE — Progress Notes (Unsigned)
I,Diane Hahn,acting as a scribe for [employer], MD.,have documented all relevant documentation on the behalf of Diane Foster, MD,as directed by  Diane Foster, MD while in the presence of Diane Foster, MD.   Complete physical exam   Patient: Diane Hahn   DOB: 25-Jun-1970   52 y.o. Female  MRN: [id number] Visit Date: 04/01/2022  Today's healthcare provider: [name], MD   Chief Complaint  Patient presents with   Annual Exam   Subjective    Diane Hahn is a 52 y.o. female who presents today for a complete physical exam.  She reports consuming a general diet. Home exercise routine includes walking 3 days a week for an hour. She generally feels well. She reports sleeping well. She does not have additional problems to discuss today.  HPI    Past Medical History:  Diagnosis Date   Allergy    Allergy to alpha-gal    HLD (hyperlipidemia)    Hypertension    Panic attack    Positive TB test    Thyroid disease    Past Surgical History:  Procedure Laterality Date   BREAST BIOPSY Right 2005   benign   BREAST REDUCTION SURGERY     2002   CESAREAN SECTION  2009, 2012   DILATION AND CURETTAGE OF UTERUS  2006   ENDOMETRIAL ABLATION  2016   FOOT SURGERY Left    2007   ovary and cyst removal     1998   REDUCTION MAMMAPLASTY Bilateral 2002   TONSILLECTOMY     1994   TUBAL LIGATION  05/25/2010   WRIST SURGERY     2022   Social History   Socioeconomic History   Marital status: Married    Spouse name: Not on file   Number of children: Not on file   Years of education: Not on file   Highest education level: Not on file  Occupational History   Not on file  Tobacco Use   Smoking status: Former   Smokeless tobacco: Not on file   Tobacco comments:    quit in 2008  Substance and Sexual Activity   Alcohol use: Yes    Comment: 1-2 glasses of wine with dinner    Drug use: No   Sexual activity: Not on  file  Other Topics Concern   Not on file  Social History Narrative   Married; full time Education officer, museum.    Social Determinants of Health   Financial Resource Strain: Not on file  Food Insecurity: Not on file  Transportation Needs: Not on file  Physical Activity: Not on file  Stress: Not on file  Social Connections: Not on file  Intimate Partner Violence: Not on file   Family Status  Relation Name Status   Mother  Alive   Father  Deceased   PGM  (Not Specified)   Other  (Not Specified)   Other  (Not Specified)   Other  (Not Specified)   Other  (Not Specified)   Other  (Not Specified)   Other  (Not Specified)   Other  (Not Specified)   Neg Hx  (Not Specified)   Family History  Problem Relation Age of Onset   Thyroid disease Mother    Heart disease Mother 25       s/p stent   Hyperlipidemia Mother    Heart attack Mother    Hypertension Father    Heart disease Father    Alzheimer's disease Paternal Grandmother  Coronary artery disease Other        family hx; and of valvular disease   Sudden death Other        family hx   Diabetes Other        family hx   Hyperlipidemia Other        family hx   Hypertension Other        family hx   Cancer Other        family hx   Thyroid disease Other        family hx    Breast cancer Neg Hx    Allergies  Allergen Reactions   Cephalexin Anaphylaxis and Other (See Comments)   Codeine Shortness Of Breath   Isoniazid Hives   Other Nausea And Vomiting    PORK/LAMB-Projectile Vomiting   Penicillins     Patient Care Team: Diane Foster, MD as PCP - General (Family Medicine)   Medications: Outpatient Medications Prior to Visit  Medication Sig   amLODipine (NORVASC) 10 MG tablet Take 10 mg by mouth daily.   clotrimazole (CLOTRIMAZOLE ANTI-FUNGAL) 1 % cream Apply 1 Application topically 2 (two) times daily.   desloratadine (CLARINEX) 5 MG tablet TAKE 1 TABLET BY MOUTH AS NEEDED.   diclofenac (VOLTAREN) 75 MG EC  tablet Take 1 tablet (75 mg total) by mouth 2 (two) times daily.   EPINEPHrine (EPIPEN 2-PAK) 0.3 mg/0.3 mL SOAJ injection Use as directed   escitalopram (LEXAPRO) 20 MG tablet TAKE 1 TABLET DAILY. MUST ESTABLISH WITH NEW PRIMARY CARE PHYSICIAN BEFORE FURTHER REFILLS   levothyroxine (SYNTHROID) 125 MCG tablet Take 1 tablet by mouth daily.   NONFORMULARY OR COMPOUNDED ITEM Achilles Tendonitis Cream-[facility] 2 refills   rosuvastatin (CRESTOR) 5 MG tablet TAKE 1 TABLET AT BEDTIME. PATIENT NEEDS TO ESTABLISH CARE WITH NEW PRIMARY CARE PHYSICIAN   potassium chloride (KLOR-CON) 20 MEQ tablet Take 1 tablet (20 mEq total) by mouth daily for 5 days.   Facility-Administered Medications Prior to Visit  Medication Dose Route Frequency Provider   betamethasone acetate-betamethasone sodium phosphate (CELESTONE) injection 12 mg  12 mg Intramuscular Once Edrick Kins, DPM    Review of Systems  HENT:  Positive for sneezing.   Allergic/Immunologic: Positive for environmental allergies and food allergies.      Objective    BP (!) 153/88 (BP Location: Left Arm, Patient Position: Sitting, Cuff Size: Normal)   Pulse 74   Resp 16   Ht 5' 6"$  (1.676 m)   Wt 161 lb 4.8 oz (73.2 kg)   BMI 26.03 kg/m     Physical Exam Vitals reviewed.  Constitutional:      General: She is not in acute distress.    Appearance: Normal appearance. She is not ill-appearing, toxic-appearing or diaphoretic.  HENT:     Head: Normocephalic and atraumatic.     Right Ear: Tympanic membrane and external ear normal. There is no impacted cerumen.     Left Ear: Tympanic membrane and external ear normal. There is no impacted cerumen.     Nose: Nose normal.     Mouth/Throat:     Pharynx: Oropharynx is clear.  Eyes:     General: No scleral icterus.    Extraocular Movements: Extraocular movements intact.     Conjunctiva/sclera: Conjunctivae normal.     Pupils: Pupils are equal, round, and reactive to light.   Cardiovascular:     Rate and Rhythm: Normal rate and regular rhythm.     Pulses: Normal pulses.  Heart sounds: Normal heart sounds. No murmur heard.    No friction rub. No gallop.  Pulmonary:     Effort: Pulmonary effort is normal. No respiratory distress.     Breath sounds: Normal breath sounds. No wheezing, rhonchi or rales.  Abdominal:     General: Bowel sounds are normal. There is no distension.     Palpations: Abdomen is soft. There is no mass.     Tenderness: There is no abdominal tenderness. There is no guarding.  Musculoskeletal:        General: No deformity.     Cervical back: Normal range of motion and neck supple. No rigidity.     Right lower leg: No edema.     Left lower leg: No edema.  Lymphadenopathy:     Cervical: No cervical adenopathy.  Skin:    General: Skin is warm.     Capillary Refill: Capillary refill takes less than 2 seconds.     Findings: Rash present. No erythema.     Comments: Posterior right arm, quarter sized, macular with fine scale   Neurological:     General: No focal deficit present.     Mental Status: She is alert and oriented to person, place, and time.     Motor: No weakness.     Gait: Gait normal.  Psychiatric:        Mood and Affect: Mood normal.        Behavior: Behavior normal.       Last depression screening scores    04/01/2022    9:15 AM 03/18/2022    1:59 PM 07/15/2015   11:25 AM  PHQ 2/9 Scores  PHQ - 2 Score 0 0 0   Last fall risk screening    04/01/2022    9:14 AM  Fall Risk   Falls in the past year? 0  Number falls in past yr: 0  Injury with Fall? 0  Risk for fall due to : No Fall Risks   Last Audit-C alcohol use screening    03/18/2022    2:00 PM  Alcohol Use Disorder Test (AUDIT)  1. How often do you have a drink containing alcohol? 4  2. How many drinks containing alcohol do you have on a typical day when you are drinking? 0  3. How often do you have six or more drinks on one occasion? 1  AUDIT-C Score 5    A score of 3 or more in women, and 4 or more in men indicates increased risk for alcohol abuse, EXCEPT if all of the points are from question 1   No results found for any visits on 04/01/22.  Assessment & Plan    Routine Health Maintenance and Physical Exam  Exercise Activities and Dietary recommendations  Goals   None     Immunization History  Administered Date(s) Administered   Influenza Split 01/05/2011, 10/29/2011   Influenza,inj,Quad PF,6+ Mos 01/08/2013   Influenza-Unspecified 12/21/2016, 12/13/2021   Moderna Sars-Covid-2 Vaccination 03/17/2019, 04/14/2019, 12/13/2019   Tdap 05/27/2010, 03/16/2019   Zoster Recombinat (Shingrix) 12/13/2021, 03/18/2022    Health Maintenance  Topic Date Due   COLONOSCOPY (Pts 45-53yr Insurance coverage will need to be confirmed)  Never done   PAP SMEAR-Modifier  04/14/2017   COVID-19 Vaccine (4 - 2023-24 season) 10/09/2021   MAMMOGRAM  08/27/2023   DTaP/Tdap/Td (3 - Td or Tdap) 03/15/2029   INFLUENZA VACCINE  Completed   Hepatitis C Screening  Completed   HIV Screening  Completed  Zoster Vaccines- Shingrix  Completed   HPV VACCINES  Aged Out    Discussed health benefits of physical activity, and encouraged her to engage in regular exercise appropriate for her age and condition.  Problem List Items Addressed This Visit       Cardiovascular and Mediastinum   Hypertension    BP  Blood pressure elevated on 3 measurements in office today Patient is asymptomatic Will have patient follow-up in 2 weeks for repeat blood pressure check In the meantime, she will continue amlodipine 10 mg daily CMP ordered today      Relevant Orders   Comprehensive metabolic panel   TSH     Other   Obesity (BMI 30-39.9)    Will screen for dyslipidemia, lipid panel ordered Will screen for diabetes, A1c ordered Will screen for thyroid abnormalities, TSH and free T4 ordered CMP ordered       Relevant Orders   Lipid panel   Hemoglobin A1c    TSH   Annual physical exam - Primary    Discussed importance of healthy diet and regular physical activity Patient given AVS with dietary recommendations Discussed recommendation for COVID boosters, patient is agreeable to COVID booster vaccination, will recommend she have this at the pharmacy Hepatitis C screening was previously completed We will order HIV screening today Will also screen for diabetes with hemoglobin A1c, Screen for dyslipidemia with lipid panel, Pap smear was completed today Referral submitted to gastroenterology for colonoscopy       Screening for cervical cancer    Pap smear completed today Will follow-up with results once available      Relevant Orders   Cytology - PAP   Vitamin D deficiency    Chronic, stable Will collect vitamin D levels today       Relevant Orders   Vitamin D (25 hydroxy)   Screening for colon cancer    Referral to gastroenterology submitted today for colonoscopy      Relevant Orders   Ambulatory referral to Gastroenterology   Allergy to alpha-gal    Patient request testing for pork allergy States that she has alpha gal disease and is allergic to [employer] allergen ordered      Relevant Orders   Allergen, Pork, f26   Screening for HIV (human immunodeficiency virus)    HIV screening ordered today      Relevant Orders   HIV Antibody (routine testing w rflx)     Return in about 2 weeks (around 04/15/2022) for HTN.       The entirety of the information documented in the History of Present Illness, Review of Systems and Physical Exam were personally obtained by me. Portions of this information were initially documented by Lyndel Pleasure, CMA and reviewed by me for thoroughness and accuracy.Diane Foster, MD    Diane Foster, MD  [facility] [phone] (phone) [phone] (fax)  [place]

## 2022-04-01 ENCOUNTER — Other Ambulatory Visit: Payer: Self-pay | Admitting: Family Medicine

## 2022-04-01 ENCOUNTER — Other Ambulatory Visit (HOSPITAL_COMMUNITY)
Admission: RE | Admit: 2022-04-01 | Discharge: 2022-04-01 | Disposition: A | Discharge status: Home / Self Care | Payer: BC Managed Care – PPO | Source: Ambulatory Visit | Attending: Family Medicine | Admitting: Family Medicine

## 2022-04-01 ENCOUNTER — Encounter: Payer: Self-pay | Admitting: Family Medicine

## 2022-04-01 ENCOUNTER — Ambulatory Visit (INDEPENDENT_AMBULATORY_CARE_PROVIDER_SITE_OTHER): Payer: BC Managed Care – PPO | Admitting: Family Medicine

## 2022-04-01 ENCOUNTER — Telehealth: Payer: Self-pay

## 2022-04-01 VITALS — BP 153/88 | HR 74 | Resp 16 | Ht 66.0 in | Wt 161.3 lb

## 2022-04-01 DIAGNOSIS — Z124 Encounter for screening for malignant neoplasm of cervix: Secondary | ICD-10-CM | POA: Insufficient documentation

## 2022-04-01 DIAGNOSIS — Z1211 Encounter for screening for malignant neoplasm of colon: Secondary | ICD-10-CM | POA: Insufficient documentation

## 2022-04-01 DIAGNOSIS — Z114 Encounter for screening for human immunodeficiency virus [HIV]: Secondary | ICD-10-CM | POA: Diagnosis not present

## 2022-04-01 DIAGNOSIS — E559 Vitamin D deficiency, unspecified: Secondary | ICD-10-CM | POA: Diagnosis not present

## 2022-04-01 DIAGNOSIS — Z91018 Allergy to other foods: Secondary | ICD-10-CM | POA: Diagnosis not present

## 2022-04-01 DIAGNOSIS — E669 Obesity, unspecified: Secondary | ICD-10-CM

## 2022-04-01 DIAGNOSIS — Z0000 Encounter for general adult medical examination without abnormal findings: Secondary | ICD-10-CM | POA: Insufficient documentation

## 2022-04-01 DIAGNOSIS — I10 Essential (primary) hypertension: Secondary | ICD-10-CM | POA: Diagnosis not present

## 2022-04-01 NOTE — Assessment & Plan Note (Signed)
Patient request testing for pork allergy States that she has alpha gal disease and is allergic to [employer] allergen ordered

## 2022-04-01 NOTE — Assessment & Plan Note (Addendum)
BP  Blood pressure elevated on 3 measurements in office today Patient is asymptomatic Will have patient follow-up in 2 weeks for repeat blood pressure check In the meantime, she will continue amlodipine 10 mg daily CMP ordered today

## 2022-04-01 NOTE — Patient Instructions (Addendum)
We will notify you of the results of your pap smear and labs once they are available.   Please be on the lookout for a call from the gastroenterology office regarding your colonoscopy.   Please apply moisturizer as well as vaseline to the area of dry skin on your right arm and let me know if it does not improve.    Please see me in 2 weeks for your blood pressure as it was elevated today  Health Maintenance, Female Adopting a healthy lifestyle and getting preventive care are important in promoting health and wellness. Ask your health care provider about: The right schedule for you to have regular tests and exams. Things you can do on your own to prevent diseases and keep yourself healthy. What should I know about diet, weight, and exercise? Eat a healthy diet  Eat a diet that includes plenty of vegetables, fruits, low-fat dairy products, and lean protein. Do not eat a lot of foods that are high in solid fats, added sugars, or sodium. Maintain a healthy weight Body mass index (BMI) is used to identify weight problems. It estimates body fat based on height and weight. Your health care provider can help determine your BMI and help you achieve or maintain a healthy weight. Get regular exercise Get regular exercise. This is one of the most important things you can do for your health. Most adults should: Exercise for at least 150 minutes each week. The exercise should increase your heart rate and make you sweat (moderate-intensity exercise). Do strengthening exercises at least twice a week. This is in addition to the moderate-intensity exercise. Spend less time sitting. Even light physical activity can be beneficial. Watch cholesterol and blood lipids Have your blood tested for lipids and cholesterol at 51 years of age, then have this test every 5 years. Have your cholesterol levels checked more often if: Your lipid or cholesterol levels are high. You are older than 51 years of age. You are  at high risk for heart disease. What should I know about cancer screening? Depending on your health history and family history, you may need to have cancer screening at various ages. This may include screening for: Breast cancer. Cervical cancer. Colorectal cancer. Skin cancer. Lung cancer. What should I know about heart disease, diabetes, and high blood pressure? Blood pressure and heart disease High blood pressure causes heart disease and increases the risk of stroke. This is more likely to develop in people who have high blood pressure readings or are overweight. Have your blood pressure checked: Every 3-5 years if you are 81-88 years of age. Every year if you are 88 years old or older. Diabetes Have regular diabetes screenings. This checks your fasting blood sugar level. Have the screening done: Once every three years after age 76 if you are at a normal weight and have a low risk for diabetes. More often and at a younger age if you are overweight or have a high risk for diabetes. What should I know about preventing infection? Hepatitis B If you have a higher risk for hepatitis B, you should be screened for this virus. Talk with your health care provider to find out if you are at risk for hepatitis B infection. Hepatitis C Testing is recommended for: Everyone born from [birth year] through 1964. Anyone with known risk factors for hepatitis C. Sexually transmitted infections (STIs) Get screened for STIs, including gonorrhea and chlamydia, if: You are sexually active and are younger than 51 years of age. You  are older than 51 years of age and your health care provider tells you that you are at risk for this type of infection. Your sexual activity has changed since you were last screened, and you are at increased risk for chlamydia or gonorrhea. Ask your health care provider if you are at risk. Ask your health care provider about whether you are at high risk for HIV. Your health care provider  may recommend a prescription medicine to help prevent HIV infection. If you choose to take medicine to prevent HIV, you should first get tested for HIV. You should then be tested every 3 months for as long as you are taking the medicine. Pregnancy If you are about to stop having your period (premenopausal) and you may become pregnant, seek counseling before you get pregnant. Take 400 to 800 micrograms (mcg) of folic acid every day if you become pregnant. Ask for birth control (contraception) if you want to prevent pregnancy. Osteoporosis and menopause Osteoporosis is a disease in which the bones lose minerals and strength with aging. This can result in bone fractures. If you are 70 years old or older, or if you are at risk for osteoporosis and fractures, ask your health care provider if you should: Be screened for bone loss. Take a calcium or vitamin D supplement to lower your risk of fractures. Be given hormone replacement therapy (HRT) to treat symptoms of menopause. Follow these instructions at home: Alcohol use Do not drink alcohol if: Your health care provider tells you not to drink. You are pregnant, may be pregnant, or are planning to become pregnant. If you drink alcohol: Limit how much you have to: 0-1 drink a day. Know how much alcohol is in your drink. In the [place] one drink equals one 12 oz bottle of beer (355 mL), one 5 oz glass of wine (148 mL), or one 1 oz glass of hard liquor (44 mL). Lifestyle Do not use any products that contain nicotine or tobacco. These products include cigarettes, chewing tobacco, and vaping devices, such as e-cigarettes. If you need help quitting, ask your health care provider. Do not use street drugs. Do not share needles. Ask your health care provider for help if you need support or information about quitting drugs. General instructions Schedule regular health, dental, and eye exams. Stay current with your vaccines. Tell your health care provider  if: You often feel depressed. You have ever been abused or do not feel safe at home. Summary Adopting a healthy lifestyle and getting preventive care are important in promoting health and wellness. Follow your health care provider's instructions about healthy diet, exercising, and getting tested or screened for diseases. Follow your health care provider's instructions on monitoring your cholesterol and blood pressure. This information is not intended to replace advice given to you by your health care provider. Make sure you discuss any questions you have with your health care provider. Document Revised: 06/16/2020 Document Reviewed: 06/16/2020 Elsevier Patient Education  [place].

## 2022-04-01 NOTE — Assessment & Plan Note (Signed)
Pap smear completed today Will follow-up with results once available

## 2022-04-01 NOTE — Assessment & Plan Note (Signed)
Will screen for dyslipidemia, lipid panel ordered Will screen for diabetes, A1c ordered Will screen for thyroid abnormalities, TSH and free T4 ordered CMP ordered

## 2022-04-01 NOTE — Assessment & Plan Note (Signed)
Discussed importance of healthy diet and regular physical activity Patient given AVS with dietary recommendations Discussed recommendation for COVID boosters, patient is agreeable to COVID booster vaccination, will recommend she have this at the pharmacy Hepatitis C screening was previously completed We will order HIV screening today Will also screen for diabetes with hemoglobin A1c, Screen for dyslipidemia with lipid panel, Pap smear was completed today Referral submitted to gastroenterology for colonoscopy

## 2022-04-01 NOTE — Assessment & Plan Note (Addendum)
Chronic, stable Will collect vitamin D levels today

## 2022-04-01 NOTE — Telephone Encounter (Signed)
Patient has been contacted to schedule her colonoscopy.  She said she would like to line it up with her husband when he is in town because he goes out of the country a lot.  Informed her that I will go ahead and send her a reminder letter to call office when she is ready to schedule.  Thanks, [employer], [place]

## 2022-04-01 NOTE — Assessment & Plan Note (Addendum)
Referral to gastroenterology submitted today for colonoscopy

## 2022-04-01 NOTE — Assessment & Plan Note (Signed)
HIV screening ordered today

## 2022-04-02 ENCOUNTER — Other Ambulatory Visit: Payer: Self-pay

## 2022-04-02 ENCOUNTER — Other Ambulatory Visit: Payer: Self-pay | Admitting: Family Medicine

## 2022-04-02 ENCOUNTER — Telehealth: Payer: Self-pay | Admitting: Gastroenterology

## 2022-04-02 DIAGNOSIS — Z1211 Encounter for screening for malignant neoplasm of colon: Secondary | ICD-10-CM

## 2022-04-02 DIAGNOSIS — R7401 Elevation of levels of liver transaminase levels: Secondary | ICD-10-CM | POA: Insufficient documentation

## 2022-04-02 LAB — HIV ANTIBODY (ROUTINE TESTING W REFLEX): HIV Screen 4th Generation wRfx: NONREACTIVE

## 2022-04-02 LAB — CYTOLOGY - PAP
Comment: NEGATIVE
Diagnosis: NEGATIVE
High risk HPV: NEGATIVE

## 2022-04-02 MED ORDER — NA SULFATE-K SULFATE-MG SULF 17.5-3.13-1.6 GM/177ML PO SOLN: 1.0000 | Freq: Once | ORAL | 0 refills | Status: AC

## 2022-04-02 NOTE — Telephone Encounter (Signed)
Gastroenterology Pre-Procedure Review  Request Date: 04/30/22 Requesting Physician: Dr. Marius Ditch  PATIENT REVIEW QUESTIONS: The patient responded to the following health history questions as indicated:    1. Are you having any GI issues?sometimes hemorrhoids treated with otc med 2. Do you have a personal history of Polyps? no 3. Do you have a family history of Colon Cancer or Polyps? no 4. Diabetes Mellitus? no 5. Joint replacements in the past 12 months?no 6. Major health problems in the past 3 months?no 7. Any artificial heart valves, MVP, or defibrillator?no    MEDICATIONS & ALLERGIES:    Patient reports the following regarding taking any anticoagulation/antiplatelet therapy:   Plavix, Coumadin, Eliquis, Xarelto, Lovenox, Pradaxa, Brilinta, or Effient? no Aspirin? no  Patient confirms/reports the following medications:  Current Outpatient Medications  Medication Sig Dispense Refill   amLODipine (NORVASC) 10 MG tablet Take 10 mg by mouth daily.     clotrimazole (CLOTRIMAZOLE ANTI-FUNGAL) 1 % cream Apply 1 Application topically 2 (two) times daily. 60 g 0   desloratadine (CLARINEX) 5 MG tablet TAKE 1 TABLET BY MOUTH AS NEEDED. 90 tablet 1   diclofenac (VOLTAREN) 75 MG EC tablet Take 1 tablet (75 mg total) by mouth 2 (two) times daily. 60 tablet 0   EPINEPHrine (EPIPEN 2-PAK) 0.3 mg/0.3 mL SOAJ injection Use as directed 2 Device 3   escitalopram (LEXAPRO) 20 MG tablet TAKE 1 TABLET DAILY. MUST ESTABLISH WITH NEW PRIMARY CARE PHYSICIAN BEFORE FURTHER REFILLS 90 tablet 0   levothyroxine (SYNTHROID) 125 MCG tablet Take 1 tablet by mouth daily.     NONFORMULARY OR COMPOUNDED ITEM Achilles Tendonitis Cream-[facility] 2 refills     potassium chloride (KLOR-CON) 20 MEQ tablet Take 1 tablet (20 mEq total) by mouth daily for 5 days. 5 tablet 0   rosuvastatin (CRESTOR) 5 MG tablet TAKE 1 TABLET AT BEDTIME. PATIENT NEEDS TO ESTABLISH CARE WITH NEW PRIMARY CARE PHYSICIAN 30 tablet 3    Current Facility-Administered Medications  Medication Dose Route Frequency Provider Last Rate Last Admin   betamethasone acetate-betamethasone sodium phosphate (CELESTONE) injection 12 mg  12 mg Intramuscular Once Edrick Kins, DPM        Patient confirms/reports the following allergies:  Allergies  Allergen Reactions   Cephalexin Anaphylaxis and Other (See Comments)   Codeine Shortness Of Breath   Isoniazid Hives   Other Nausea And Vomiting    PORK/LAMB-Projectile Vomiting   Penicillins     No orders of the defined types were placed in this encounter.   AUTHORIZATION INFORMATION Primary Insurance: 1D#: Group #:  Secondary Insurance: 1D#: Group #:  SCHEDULE INFORMATION: Date: 04/30/22 Time: Location: [facility]

## 2022-04-02 NOTE — Addendum Note (Signed)
Addended by: Vanetta Mulders on: 04/02/2022 09:59 AM   Modules accepted: Orders

## 2022-04-02 NOTE — Telephone Encounter (Signed)
Pt returned call in ref to scheduling procedure callback--[phone]

## 2022-04-04 LAB — COMPREHENSIVE METABOLIC PANEL
ALT: 69 IU/L — ABNORMAL HIGH (ref 0–32)
AST: 216 IU/L — ABNORMAL HIGH (ref 0–40)
Albumin/Globulin Ratio: 2 (ref 1.2–2.2)
Albumin: 5 g/dL — ABNORMAL HIGH (ref 3.9–4.9)
Alkaline Phosphatase: 113 IU/L (ref 44–121)
BUN/Creatinine Ratio: 7 — ABNORMAL LOW (ref 9–23)
BUN: 5 mg/dL — ABNORMAL LOW (ref 6–24)
Bilirubin Total: 0.7 mg/dL (ref 0.0–1.2)
CO2: 22 mmol/L (ref 20–29)
Calcium: 10.5 mg/dL — ABNORMAL HIGH (ref 8.7–10.2)
Chloride: 101 mmol/L (ref 96–106)
Creatinine, Ser: 0.69 mg/dL (ref 0.57–1.00)
Globulin, Total: 2.5 g/dL (ref 1.5–4.5)
Glucose: 106 mg/dL — ABNORMAL HIGH (ref 70–99)
Potassium: 3.7 mmol/L (ref 3.5–5.2)
Sodium: 143 mmol/L (ref 134–144)
Total Protein: 7.5 g/dL (ref 6.0–8.5)
eGFR: 106 mL/min/{1.73_m2} (ref >59)

## 2022-04-04 LAB — LIPID PANEL
Chol/HDL Ratio: 4.7 ratio — ABNORMAL HIGH (ref 0.0–4.4)
Cholesterol, Total: 233 mg/dL — ABNORMAL HIGH (ref 100–199)
HDL: 50 mg/dL (ref >39)
LDL Chol Calc (NIH): 144 mg/dL — ABNORMAL HIGH (ref 0–99)
Triglycerides: 218 mg/dL — ABNORMAL HIGH (ref 0–149)
VLDL Cholesterol Cal: 39 mg/dL (ref 5–40)

## 2022-04-04 LAB — HEMOGLOBIN A1C
Est. average glucose Bld gHb Est-mCnc: 100 mg/dL
Hgb A1c MFr Bld: 5.1 % (ref 4.8–5.6)

## 2022-04-04 LAB — VITAMIN D 25 HYDROXY (VIT D DEFICIENCY, FRACTURES): Vit D, 25-Hydroxy: 33.3 ng/mL (ref 30.0–100.0)

## 2022-04-04 LAB — ALLERGEN, PORK, F26: Pork IgE: 1.49 kU/L — AB

## 2022-04-04 LAB — TSH: TSH: 5.11 u[IU]/mL — ABNORMAL HIGH (ref 0.450–4.500)

## 2022-04-05 ENCOUNTER — Telehealth: Payer: Self-pay

## 2022-04-05 ENCOUNTER — Other Ambulatory Visit: Payer: Self-pay

## 2022-04-05 DIAGNOSIS — E079 Disorder of thyroid, unspecified: Secondary | ICD-10-CM

## 2022-04-05 MED ORDER — LEVOTHYROXINE SODIUM 112 MCG PO TABS: 112.0000 ug | ORAL_TABLET | Freq: Every day | ORAL | 0 refills | Status: DC

## 2022-04-05 NOTE — Telephone Encounter (Signed)
Copied from [place] [id number]. Topic: General - Other >> Apr 05, 2022 11:13 AM Sabas Sous wrote: Reason for CRM: Pt has questions about why a colonoscopy was ordered for her.

## 2022-04-05 NOTE — Telephone Encounter (Signed)
Please see mychart message.

## 2022-04-08 ENCOUNTER — Encounter: Payer: Self-pay | Admitting: Family Medicine

## 2022-04-08 ENCOUNTER — Other Ambulatory Visit: Payer: Self-pay | Admitting: Family Medicine

## 2022-04-08 DIAGNOSIS — E079 Disorder of thyroid, unspecified: Secondary | ICD-10-CM

## 2022-04-08 MED ORDER — LEVOTHYROXINE SODIUM 112 MCG PO TABS: 112.0000 ug | ORAL_TABLET | Freq: Every day | ORAL | 1 refills | Status: DC

## 2022-04-08 NOTE — Telephone Encounter (Signed)
Last RF 04/05/22 90 tablets  Requested Prescriptions  Refused Prescriptions Disp Refills   levothyroxine (SYNTHROID) 112 MCG tablet [Pharmacy Med Name: LEVOTHYROXINE 112 MCG TABLET] 90 tablet 0    Sig: TAKE 1 TABLET BY MOUTH EVERY DAY     Endocrinology:  Hypothyroid Agents Failed - 04/08/2022 10:33 AM      Failed - TSH in normal range and within 360 days    TSH  Date Value Ref Range Status  04/01/2022 5.110 (H) 0.450 - 4.500 uIU/mL Final         Passed - Valid encounter within last 12 months    Recent Outpatient Visits           1 week ago Annual physical exam   Ehrhardt, Douglas, MD   3 weeks ago Encounter to establish care   [facility] Faith, Riki Sheer, MD

## 2022-04-12 LAB — LIPID PANEL

## 2022-04-12 LAB — REFERENCE MICRO PROBLEM TEST

## 2022-04-12 LAB — FSH, (3 SPECIMENS)

## 2022-04-12 LAB — TSH

## 2022-04-12 LAB — COMPREHENSIVE METABOLIC PANEL

## 2022-04-12 LAB — VITAMIN D 25 HYDROXY (VIT D DEFICIENCY, FRACTURES)

## 2022-04-12 LAB — HEMOGLOBIN A1C

## 2022-04-12 LAB — ALLERGEN, PORK, F26

## 2022-04-13 ENCOUNTER — Ambulatory Visit
Admission: RE | Admit: 2022-04-13 | Discharge: 2022-04-13 | Disposition: A | Discharge status: Home / Self Care | Payer: BC Managed Care – PPO | Source: Ambulatory Visit | Attending: Family Medicine | Admitting: Family Medicine

## 2022-04-13 ENCOUNTER — Telehealth: Payer: Self-pay | Admitting: *Deleted

## 2022-04-13 DIAGNOSIS — R7401 Elevation of levels of liver transaminase levels: Secondary | ICD-10-CM | POA: Diagnosis not present

## 2022-04-13 DIAGNOSIS — R945 Abnormal results of liver function studies: Secondary | ICD-10-CM | POA: Diagnosis not present

## 2022-04-13 NOTE — Telephone Encounter (Signed)
Pt given ultrasound results per notes of Dr. Quentin Cornwall on 04/13/22. Pt verbalized understanding right upper quadrant ultrasound showed findings consistent with fatty liver changes. No signes of gallbladder stones or acute inflammation of the gallbladder. Patient reports she is taking milk thistle and has more energy and sleeping better.

## 2022-04-29 ENCOUNTER — Encounter: Payer: Self-pay | Admitting: Gastroenterology

## 2022-04-30 ENCOUNTER — Ambulatory Visit
Admission: RE | Admit: 2022-04-30 | Discharge: 2022-04-30 | Disposition: A | Discharge status: Home / Self Care | Payer: BC Managed Care – PPO | Source: Ambulatory Visit | Attending: Gastroenterology | Admitting: Gastroenterology

## 2022-04-30 ENCOUNTER — Ambulatory Visit: Payer: BC Managed Care – PPO | Admitting: Certified Registered Nurse Anesthetist

## 2022-04-30 ENCOUNTER — Encounter
Admission: RE | Disposition: A | Discharge status: Home / Self Care | Payer: Self-pay | Source: Ambulatory Visit | Attending: Gastroenterology

## 2022-04-30 ENCOUNTER — Telehealth: Payer: Self-pay

## 2022-04-30 DIAGNOSIS — I10 Essential (primary) hypertension: Secondary | ICD-10-CM | POA: Diagnosis not present

## 2022-04-30 DIAGNOSIS — E079 Disorder of thyroid, unspecified: Secondary | ICD-10-CM | POA: Diagnosis not present

## 2022-04-30 DIAGNOSIS — F419 Anxiety disorder, unspecified: Secondary | ICD-10-CM | POA: Diagnosis not present

## 2022-04-30 DIAGNOSIS — Z1211 Encounter for screening for malignant neoplasm of colon: Secondary | ICD-10-CM | POA: Insufficient documentation

## 2022-04-30 DIAGNOSIS — Z87891 Personal history of nicotine dependence: Secondary | ICD-10-CM | POA: Insufficient documentation

## 2022-04-30 DIAGNOSIS — Z79899 Other long term (current) drug therapy: Secondary | ICD-10-CM | POA: Diagnosis not present

## 2022-04-30 DIAGNOSIS — Z79890 Hormone replacement therapy (postmenopausal): Secondary | ICD-10-CM | POA: Insufficient documentation

## 2022-04-30 DIAGNOSIS — E785 Hyperlipidemia, unspecified: Secondary | ICD-10-CM | POA: Diagnosis not present

## 2022-04-30 DIAGNOSIS — K5790 Diverticulosis of intestine, part unspecified, without perforation or abscess without bleeding: Secondary | ICD-10-CM | POA: Diagnosis not present

## 2022-04-30 HISTORY — PX: COLONOSCOPY WITH PROPOFOL: SHX5780

## 2022-04-30 SURGERY — COLONOSCOPY WITH PROPOFOL
Anesthesia: General

## 2022-04-30 MED ORDER — PROPOFOL 500 MG/50ML IV EMUL
INTRAVENOUS | Status: DC | PRN
  Administered 2022-04-30: 150 ug/kg/min via INTRAVENOUS

## 2022-04-30 MED ORDER — DEXMEDETOMIDINE HCL IN NACL 80 MCG/20ML IV SOLN
INTRAVENOUS | Status: DC | PRN
  Administered 2022-04-30: 8 ug via BUCCAL

## 2022-04-30 MED ORDER — LIDOCAINE HCL (CARDIAC) PF 100 MG/5ML IV SOSY
PREFILLED_SYRINGE | INTRAVENOUS | Status: DC | PRN
  Administered 2022-04-30: 50 mg via INTRAVENOUS

## 2022-04-30 MED ORDER — PROPOFOL 10 MG/ML IV BOLUS
INTRAVENOUS | Status: DC | PRN
  Administered 2022-04-30: 20 mg via INTRAVENOUS
  Administered 2022-04-30: 80 mg via INTRAVENOUS

## 2022-04-30 MED ORDER — SODIUM CHLORIDE 0.9 % IV SOLN
INTRAVENOUS | Status: DC
  Administered 2022-04-30: 20 mL/h via INTRAVENOUS

## 2022-04-30 NOTE — Anesthesia Preprocedure Evaluation (Signed)
Anesthesia Evaluation  Patient identified by MRN, date of birth, ID band Patient awake    Reviewed: Allergy & Precautions, NPO status , Patient's Chart, lab work & pertinent test results  Airway Mallampati: II  TM Distance: >3 FB Neck ROM: Full    Dental  (+) Teeth Intact   Pulmonary neg pulmonary ROS, COPD, former smoker   Pulmonary exam normal  + decreased breath sounds      Cardiovascular hypertension, negative cardio ROS Normal cardiovascular exam Rhythm:Regular Rate:Normal     Neuro/Psych   Anxiety     negative neurological ROS  negative psych ROS   GI/Hepatic negative GI ROS, Neg liver ROS,,,  Endo/Other  negative endocrine ROS    Renal/GU negative Renal ROS  negative genitourinary   Musculoskeletal   Abdominal Normal abdominal exam  (+)   Peds  Hematology negative hematology ROS (+)   Anesthesia Other Findings Past Medical History: No date: Allergy 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 No date: HLD (hyperlipidemia) No date: Hypertension No date: Panic attack 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 No date: Thyroid disease  Past Surgical History: 2005: BREAST BIOPSY; Right     Comment:  benign No date: BREAST REDUCTION SURGERY     Comment:  2002 2009, 2012: CESAREAN SECTION 2006: DILATION AND CURETTAGE OF UTERUS 2016: ENDOMETRIAL ABLATION No date: FOOT SURGERY; Left     Comment:  2007 No date: ovary and cyst removal     Comment:  1998 2002: REDUCTION MAMMAPLASTY; Bilateral No date: TONSILLECTOMY     Comment:  1994 05/25/2010: TUBAL LIGATION No date: WRIST SURGERY     Comment:  2022  BMI    Body Mass Index: 25.28 kg/m      Reproductive/Obstetrics negative OB ROS                             Anesthesia Physical Anesthesia Plan  ASA: 2  Anesthesia Plan: General   Post-op Pain Management:    Induction: Intravenous  PONV Risk Score and Plan: Propofol infusion and TIVA  Airway  Management Planned: Natural Airway  Additional Equipment:   Intra-op Plan:   Post-operative Plan:   Informed Consent: I have reviewed the patients History and Physical, chart, labs and discussed the procedure including the risks, benefits and alternatives for the proposed anesthesia with the patient or authorized representative who has indicated his/her understanding and acceptance.     Dental Advisory Given  Plan Discussed with: CRNA and Surgeon  Anesthesia Plan Comments:        Anesthesia Quick Evaluation

## 2022-04-30 NOTE — Anesthesia Procedure Notes (Signed)
Date/Time: 04/30/2022 8:53 AM  Performed by: Johnna Acosta, CRNAPre-anesthesia Checklist: Patient identified, Emergency Drugs available, Suction available, Patient being monitored and Timeout performed Patient Re-evaluated:Patient Re-evaluated prior to induction Oxygen Delivery Method: Nasal cannula Preoxygenation: Pre-oxygenation with 100% oxygen Induction Type: IV induction

## 2022-04-30 NOTE — Op Note (Signed)
[facility] Gastroenterology Patient Name: Diane Hahn Procedure Date: 04/30/2022 8:53 AM MRN: [id number] Account #: [id number] Date of Birth: 15-Jun-1970 Admit Type: Outpatient Age: 52 Room: [facility] ENDO ROOM 2 Gender: Female Note Status: Finalized Instrument Name: [name] Procedure:             Colonoscopy Indications:           Screening for colorectal malignant neoplasm, This is                         the patient's first colonoscopy Providers:             Lin Landsman MD, MD Medicines:             General Anesthesia Complications:         No immediate complications. Estimated blood loss: None. Procedure:             Pre-Anesthesia Assessment:                        - Prior to the procedure, a History and Physical was                         performed, and patient medications and allergies were                         reviewed. The patient is competent. The risks and                         benefits of the procedure and the sedation options and                         risks were discussed with the patient. All questions                         were answered and informed consent was obtained.                         Patient identification and proposed procedure were                         verified by the physician, the nurse, the                         anesthesiologist, the anesthetist and the technician                         in the pre-procedure area in the procedure room in the                         endoscopy suite. Mental Status Examination: alert and                         oriented. Airway Examination: normal oropharyngeal                         airway and neck mobility. Respiratory Examination:                         clear to auscultation.  CV Examination: normal.                         Prophylactic Antibiotics: The patient does not require                         prophylactic antibiotics. Prior Anticoagulants: The                          patient has taken no anticoagulant or antiplatelet                         agents. ASA Grade Assessment: II - A patient with mild                         systemic disease. After reviewing the risks and                         benefits, the patient was deemed in satisfactory                         condition to undergo the procedure. The anesthesia                         plan was to use general anesthesia. Immediately prior                         to administration of medications, the patient was                         re-assessed for adequacy to receive sedatives. The                         heart rate, respiratory rate, oxygen saturations,                         blood pressure, adequacy of pulmonary ventilation, and                         response to care were monitored throughout the                         procedure. The physical status of the patient was                         re-assessed after the procedure.                        After obtaining informed consent, the colonoscope was                         passed under direct vision. Throughout the procedure,                         the patient's blood pressure, pulse, and oxygen                         saturations were monitored continuously. The  Colonoscope was introduced through the anus and                         advanced to the the cecum, identified by appendiceal                         orifice and ileocecal valve. The colonoscopy was                         performed with moderate difficulty due to inadequate                         bowel prep. The patient tolerated the procedure well.                         The quality of the bowel preparation was poor. The                         ileocecal valve, appendiceal orifice, and rectum were                         photographed. Findings:      The perianal and digital rectal examinations were normal. Pertinent       negatives include normal  sphincter tone and no palpable rectal lesions.      Extensive amounts of liquid stool was found in the entire colon,       precluding visualization.      The retroflexed view of the distal rectum and anal verge was normal and       showed no anal or rectal abnormalities. Impression:            - Preparation of the colon was poor.                        - Stool in the entire examined colon.                        - The distal rectum and anal verge are normal on                         retroflexion view.                        - No specimens collected. Recommendation:        - Discharge patient to home (with escort).                        - Resume previous diet today.                        - Continue present medications.                        - Repeat colonoscopy at next available appointment                         (within 3 months) with 2 day prep because the bowel  preparation was suboptimal. Procedure Code(s):     --- Professional ---                        [id number], Colorectal cancer screening; colonoscopy on                         individual not meeting criteria for high risk Diagnosis Code(s):     --- Professional ---                        Z12.11, Encounter for screening for malignant neoplasm                         of colon CPT copyright 2022 American Medical Association. All rights reserved. The codes documented in this report are preliminary and upon coder review may  be revised to meet current compliance requirements. Dr. Ulyess Mort Lin Landsman MD, MD 04/30/2022 9:14:53 AM This report has been signed electronically. Number of Addenda: 0 Note Initiated On: 04/30/2022 8:53 AM Scope Withdrawal Time: 0 hours 6 minutes 8 seconds  Total Procedure Duration: 0 hours 11 minutes 56 seconds  Estimated Blood Loss:  Estimated blood loss: none.      [facility]

## 2022-04-30 NOTE — Anesthesia Postprocedure Evaluation (Signed)
Anesthesia Post Note  Patient: Diane Hahn  Procedure(s) Performed: COLONOSCOPY WITH PROPOFOL  Patient location during evaluation: PACU Anesthesia Type: General Level of consciousness: awake and awake and alert Pain management: pain level controlled Vital Signs Assessment: post-procedure vital signs reviewed and stable Respiratory status: spontaneous breathing Cardiovascular status: stable Anesthetic complications: no   No notable events documented.   Last Vitals:  Vitals:   04/30/22 0917 04/30/22 0927  BP: 129/85 (!) 127/113  Pulse: 84 61  Resp: 12 10  Temp:    SpO2: 98% 97%    Last Pain:  Vitals:   04/30/22 0927  TempSrc:   PainSc: 0-No pain                 VAN STAVEREN,Orianna Biskup

## 2022-04-30 NOTE — H&P (Signed)
Cephas Darby, MD [address]  [place]  [place], [place] [address]  Main: [phone]  Fax: [phone] Pager: [phone]  Primary Care Physician:  [name], MD Primary Gastroenterologist:  Dr. Cephas Darby  Pre-Procedure History & Physical: HPI:  Diane Hahn is a 52 y.o. female is here for an colonoscopy.   Past Medical History:  Diagnosis Date   Allergy    Allergy to alpha-gal    HLD (hyperlipidemia)    Hypertension    Panic attack    Positive TB test    Thyroid disease     Past Surgical History:  Procedure Laterality Date   BREAST BIOPSY Right 2005   benign   BREAST REDUCTION SURGERY     2002   CESAREAN SECTION  2009, 2012   DILATION AND CURETTAGE OF UTERUS  2006   ENDOMETRIAL ABLATION  2016   FOOT SURGERY Left    2007   ovary and cyst removal     1998   REDUCTION MAMMAPLASTY Bilateral 2002   TONSILLECTOMY     1994   TUBAL LIGATION  05/25/2010   WRIST SURGERY     2022    Prior to Admission medications   Medication Sig Start Date End Date Taking? Authorizing Provider  amLODipine (NORVASC) 10 MG tablet Take 10 mg by mouth daily.    [provider]  desloratadine (CLARINEX) 5 MG tablet TAKE 1 TABLET BY MOUTH AS NEEDED. 04/28/16   Burnard Hawthorne, FNP  diclofenac (VOLTAREN) 75 MG EC tablet Take 1 tablet (75 mg total) by mouth 2 (two) times daily. 10/17/15   Edrick Kins, DPM  EPINEPHrine (EPIPEN 2-PAK) 0.3 mg/0.3 mL SOAJ injection Use as directed 01/08/13   Jackolyn Confer, MD  escitalopram (LEXAPRO) 20 MG tablet TAKE 1 TABLET DAILY. MUST ESTABLISH WITH NEW PRIMARY CARE PHYSICIAN BEFORE FURTHER REFILLS 02/11/17   Burnard Hawthorne, FNP  levothyroxine (SYNTHROID) 112 MCG tablet Take 1 tablet (112 mcg total) by mouth daily. 04/08/22   Simmons-Robinson, Riki Sheer, MD  NONFORMULARY OR COMPOUNDED ITEM Achilles Tendonitis [facility] 2 refills    [provider]  potassium chloride (KLOR-CON) 20 MEQ  tablet Take 1 tablet (20 mEq total) by mouth daily for 5 days. 07/22/20 07/27/20  Cuthriell, Charline Bills, PA-C  rosuvastatin (CRESTOR) 5 MG tablet TAKE 1 TABLET AT BEDTIME. PATIENT NEEDS TO ESTABLISH CARE WITH NEW PRIMARY CARE PHYSICIAN 12/29/16   Burnard Hawthorne, FNP    Allergies as of 04/02/2022 - Review Complete 04/02/2022  Allergen Reaction Noted   Cephalexin Anaphylaxis and Other (See Comments) 07/11/2009   Codeine Shortness Of Breath 07/15/2015   Isoniazid Hives 07/15/2015   Other Nausea And Vomiting 03/18/2022   Penicillins  07/11/2009    Family History  Problem Relation Age of Onset   Thyroid disease Mother    Heart disease Mother 33       s/p stent   Hyperlipidemia Mother    Heart attack Mother    Hypertension Father    Heart disease Father    Alzheimer's disease Paternal Grandmother    Coronary artery disease Other        family hx; and of valvular disease   Sudden death Other        family hx   Diabetes Other        family hx   Hyperlipidemia Other        family hx   Hypertension Other  family hx   Cancer Other        family hx   Thyroid disease Other        family hx    Breast cancer Neg Hx     Social History   Socioeconomic History   Marital status: Married    Spouse name: Not on file   Number of children: Not on file   Years of education: Not on file   Highest education level: Not on file  Occupational History   Not on file  Tobacco Use   Smoking status: Former   Smokeless tobacco: Not on file   Tobacco comments:    quit in 2008  Vaping Use   Vaping Use: Never used  Substance and Sexual Activity   Alcohol use: Yes    Comment: 1-2 glasses of wine with dinner    Drug use: No   Sexual activity: Not on file  Other Topics Concern   Not on file  Social History Narrative   Married; full time Education officer, [employer].    Social Determinants of Health   Financial Resource Strain: Not on file  Food Insecurity: Not on file  Transportation Needs:  Not on file  Physical Activity: Not on file  Stress: Not on file  Social Connections: Not on file  Intimate Partner Violence: Not on file    Review of Systems: See HPI, otherwise negative ROS  Physical Exam: BP (!) 143/91   Pulse 83   Temp 98 F (36.7 C) (Temporal)   Resp 20   Ht 5\' 6"  (1.676 m)   Wt 71 kg   SpO2 98%   BMI 25.28 kg/m  General:   Alert,  pleasant and cooperative in NAD Head:  Normocephalic and atraumatic. Neck:  Supple; no masses or thyromegaly. Lungs:  Clear throughout to auscultation.    Heart:  Regular rate and rhythm. Abdomen:  Soft, nontender and nondistended. Normal bowel sounds, without guarding, and without rebound.   Neurologic:  Alert and  oriented x4;  grossly normal neurologically.  Impression/Plan: Diane Hahn is here for an colonoscopy to be performed for colon cancer screening  Risks, benefits, limitations, and alternatives regarding  colonoscopy have been reviewed with the patient.  Questions have been answered.  All parties agreeable.   Sherri Sear, MD  04/30/2022, 8:44 AM

## 2022-04-30 NOTE — Transfer of Care (Signed)
Immediate Anesthesia Transfer of Care Note  Patient: Diane Hahn  Procedure(s) Performed: COLONOSCOPY WITH PROPOFOL  Patient Location: Endoscopy Unit  Anesthesia Type:General  Level of Consciousness: awake and alert   Airway & Oxygen Therapy: Patient Spontanous Breathing  Post-op Assessment: Report given to RN and Post -op Vital signs reviewed and stable  Post vital signs: Reviewed and stable  Last Vitals:  Vitals Value Taken Time  BP 129/85 04/30/22 0917  Temp    Pulse 84 04/30/22 0917  Resp 12 04/30/22 0917  SpO2 98 % 04/30/22 0917    Last Pain:  Vitals:   04/30/22 0917  TempSrc:   PainSc: 0-No pain         Complications: No notable events documented.

## 2022-04-30 NOTE — Telephone Encounter (Signed)
Patient was not clean out with colonoscopy today and needs a repeat colonoscopy with in 3 months with 2 day prep. Called and patient states she will not do a 2 day prep and will not have repeat colonoscopy because the one day prep was hard enough to do. She states that she has been crying all morning about this.

## 2022-05-03 ENCOUNTER — Encounter: Payer: Self-pay | Admitting: Gastroenterology

## 2022-05-21 DIAGNOSIS — F4323 Adjustment disorder with mixed anxiety and depressed mood: Secondary | ICD-10-CM | POA: Diagnosis not present

## 2022-05-24 DIAGNOSIS — J029 Acute pharyngitis, unspecified: Secondary | ICD-10-CM | POA: Diagnosis not present

## 2022-05-24 DIAGNOSIS — Z6827 Body mass index (BMI) 27.0-27.9, adult: Secondary | ICD-10-CM | POA: Diagnosis not present

## 2022-05-24 DIAGNOSIS — J069 Acute upper respiratory infection, unspecified: Secondary | ICD-10-CM | POA: Diagnosis not present

## 2022-05-24 DIAGNOSIS — I10 Essential (primary) hypertension: Secondary | ICD-10-CM | POA: Diagnosis not present

## 2022-06-15 ENCOUNTER — Encounter: Payer: Self-pay | Admitting: Family Medicine

## 2022-06-15 ENCOUNTER — Ambulatory Visit: Payer: BC Managed Care – PPO | Admitting: Family Medicine

## 2022-06-15 VITALS — BP 164/90 | HR 69 | Temp 98.2°F | Resp 16 | Wt 160.0 lb

## 2022-06-15 DIAGNOSIS — E039 Hypothyroidism, unspecified: Secondary | ICD-10-CM | POA: Diagnosis not present

## 2022-06-15 DIAGNOSIS — E782 Mixed hyperlipidemia: Secondary | ICD-10-CM | POA: Diagnosis not present

## 2022-06-15 DIAGNOSIS — I10 Essential (primary) hypertension: Secondary | ICD-10-CM

## 2022-06-15 DIAGNOSIS — Z0000 Encounter for general adult medical examination without abnormal findings: Secondary | ICD-10-CM

## 2022-06-15 DIAGNOSIS — R61 Generalized hyperhidrosis: Secondary | ICD-10-CM

## 2022-06-15 DIAGNOSIS — Z1211 Encounter for screening for malignant neoplasm of colon: Secondary | ICD-10-CM

## 2022-06-15 MED ORDER — HYDROCHLOROTHIAZIDE 12.5 MG PO CAPS: 12.5000 mg | ORAL_CAPSULE | Freq: Every day | ORAL | 1 refills | Status: DC

## 2022-06-15 MED ORDER — ALUMINUM CHLORIDE HEXAHYDRATE CRYS: CRYSTALS | 1 refills | Status: DC

## 2022-06-15 NOTE — Assessment & Plan Note (Signed)
Intermittent chronic problem  Will prescribe topical aluminum crystal to help with sweating

## 2022-06-15 NOTE — Progress Notes (Signed)
I,Diane Hahn,acting as a scribe for [employer], MD.,have documented all relevant documentation on the behalf of Ronnald Ramp, MD,as directed by  Ronnald Ramp, MD while in the presence of Ronnald Ramp, MD.   Established patient visit   Patient: Diane Hahn   DOB: 1970/04/13   51 y.o. Female  MRN: [id number] Visit Date: 06/15/2022  Today's healthcare provider: Ronnald Ramp, MD   Chief Complaint  Patient presents with   Follow-up   Subjective    HPI   Hypertension, follow-up  BP Readings from Last 3 Encounters:  06/15/22 (!) 164/90  04/30/22 (!) 158/81  04/01/22 (!) 153/88   Wt Readings from Last 3 Encounters:  06/15/22 160 lb (72.6 kg)  04/30/22 156 lb 9.6 oz (71 kg)  04/01/22 161 lb 4.8 oz (73.2 kg)     She was last seen for hypertension 3 months ago.  BP at that visit was 153/88. Management since that visit includes continue Amlodipine 10 mg daily. She reports that her BP is higher during the day and lower at night, often too low at night  States that she usually has amlodipine in the AM but not always at the same time    She reports excellent compliance with treatment.  Outside blood pressures are 100-150/60-90's Patient brings in bp wrist cuff and it reads 152/96 P 70  Symptoms: No chest pain No chest pressure  No palpitations No syncope  No dyspnea No orthopnea  No paroxysmal nocturnal dyspnea No lower extremity edema   Pertinent labs Lab Results  Component Value Date   CHOL 233 (H) 04/01/2022   HDL 50 04/01/2022   LDLCALC 144 (H) 04/01/2022   LDLDIRECT 112.0 07/15/2015   TRIG 218 (H) 04/01/2022   CHOLHDL 4.7 (H) 04/01/2022   Lab Results  Component Value Date   NA 143 04/01/2022   K 3.7 04/01/2022   CREATININE 0.69 04/01/2022   EGFR 106 04/01/2022   GLUCOSE 106 (H) 04/01/2022   TSH 5.110 (H) 04/01/2022     The 10-year ASCVD risk score (Arnett DK, et al., 2019) is:  10.5%  --------------------------------------------------------------------------------------------------- HLD : takes cholesterol medicines when she remembers.  Lab Results  Component Value Date   CHOL 233 (H) 04/01/2022   HDL 50 04/01/2022   LDLCALC 144 (H) 04/01/2022   LDLDIRECT 112.0 07/15/2015   TRIG 218 (H) 04/01/2022   CHOLHDL 4.7 (H) 04/01/2022   Excessive sweating  She reports that she had excessive sweating in her 20s and then it resolved spontaneously  She reports that she has sweating in her axillae and on the soles of her feet  Reports that she has been using deodorant multiple times per day   Avoid Colonoscopy  Reports that colonoscopy was a traumatic experience and would prefer to have cologuard  Was recommended to have repeat endoscopy due to inadequate cleanse prior to procedure  Patient has been discussing this with therapist   Medications: Outpatient Medications Prior to Visit  Medication Sig   amLODipine (NORVASC) 10 MG tablet Take 10 mg by mouth daily.   desloratadine (CLARINEX) 5 MG tablet TAKE 1 TABLET BY MOUTH AS NEEDED.   EPINEPHrine (EPIPEN 2-PAK) 0.3 mg/0.3 mL SOAJ injection Use as directed   escitalopram (LEXAPRO) 20 MG tablet TAKE 1 TABLET DAILY. MUST ESTABLISH WITH NEW PRIMARY CARE PHYSICIAN BEFORE FURTHER REFILLS   levothyroxine (SYNTHROID) 112 MCG tablet Take 1 tablet (112 mcg total) by mouth daily.   rosuvastatin (CRESTOR) 5 MG tablet TAKE 1  TABLET AT BEDTIME. PATIENT NEEDS TO ESTABLISH CARE WITH NEW PRIMARY CARE PHYSICIAN   [DISCONTINUED] diclofenac (VOLTAREN) 75 MG EC tablet Take 1 tablet (75 mg total) by mouth 2 (two) times daily.   [DISCONTINUED] NONFORMULARY OR COMPOUNDED ITEM Achilles Tendonitis [facility] 2 refills   [DISCONTINUED] potassium chloride (KLOR-CON) 20 MEQ tablet Take 1 tablet (20 mEq total) by mouth daily for 5 days.   Facility-Administered Medications Prior to Visit  Medication Dose Route Frequency Provider    betamethasone acetate-betamethasone sodium phosphate (CELESTONE) injection 12 mg  12 mg Intramuscular Once Felecia Shelling, DPM    Review of Systems     Objective    BP (!) 164/90 (BP Location: Left Arm, Patient Position: Sitting, Cuff Size: Normal)   Pulse 69   Temp 98.2 F (36.8 C) (Oral)   Resp 16   Wt 160 lb (72.6 kg)   BMI 25.82 kg/m    Physical Exam Vitals reviewed.  Constitutional:      General: She is not in acute distress.    Appearance: Normal appearance. She is not ill-appearing, toxic-appearing or diaphoretic.  Eyes:     Conjunctiva/sclera: Conjunctivae normal.  Cardiovascular:     Rate and Rhythm: Normal rate and regular rhythm.     Pulses: Normal pulses.     Heart sounds: Normal heart sounds. No murmur heard.    No friction rub. No gallop.  Pulmonary:     Effort: Pulmonary effort is normal. No respiratory distress.     Breath sounds: Normal breath sounds. No stridor. No wheezing, rhonchi or rales.  Abdominal:     General: Bowel sounds are normal. There is no distension.     Palpations: Abdomen is soft.     Tenderness: There is no abdominal tenderness.  Musculoskeletal:     Right lower leg: No edema.     Left lower leg: No edema.  Skin:    Findings: No erythema or rash.  Neurological:     Mental Status: She is alert and oriented to person, place, and time.      No results found for any visits on 06/15/22.  Assessment & Plan     Problem List Items Addressed This Visit       Cardiovascular and Mediastinum   Hypertension - Primary    Chronic  TSH & free T4 collected today  BP elevated in office today  Report of varied BP recordings with wide range  Will add microzide 12.5mg  daily and continue amlodipine 10mg  daily  CMP collected today  F/u in 2 weeks for BP  Aldosterone and renin collected today       Relevant Medications   hydrochlorothiazide (MICROZIDE) 12.5 MG capsule   Other Relevant Orders   Comprehensive metabolic panel   TSH + free  T4   Aldosterone + renin activity w/ ratio     Endocrine   Acquired hypothyroidism    Chronic  Repeat TsH and free T4        Relevant Orders   TSH + free T4     Musculoskeletal and Integument   Hyperhidrosis    Intermittent chronic problem  Will prescribe topical aluminum crystal to help with sweating          Other   Hyperlipidemia    Patient has tolerated statin therapy  Will repeat CMP and lipid panel today        Relevant Medications   hydrochlorothiazide (MICROZIDE) 12.5 MG capsule   Other Relevant Orders   Lipid Profile  Healthcare maintenance    Patient prefers cologuard for future colon cancer screenings  Cologuard ordered today       Other Visit Diagnoses     Colon cancer screening       Relevant Orders   Cologuard        Return in about 2 weeks (around 06/29/2022) for HTN.        The entirety of the information documented in the History of Present Illness, Review of Systems and Physical Exam were personally obtained by me. Portions of this information were initially documented by Hetty Ely, CMA . I, Ronnald Ramp, MD have reviewed the documentation above for thoroughness and accuracy.      Ronnald Ramp, MD  [facility] [phone] (phone) [phone] (fax)  [facility]

## 2022-06-15 NOTE — Assessment & Plan Note (Signed)
Patient prefers cologuard for future colon cancer screenings  Cologuard ordered today

## 2022-06-15 NOTE — Assessment & Plan Note (Signed)
Chronic  Repeat TsH and free T4

## 2022-06-15 NOTE — Assessment & Plan Note (Signed)
Patient has tolerated statin therapy  Will repeat CMP and lipid panel today

## 2022-06-15 NOTE — Assessment & Plan Note (Addendum)
Chronic  TSH & free T4 collected today  BP elevated in office today  Report of varied BP recordings with wide range  Will add microzide 12.5mg  daily and continue amlodipine 10mg  daily  CMP collected today  F/u in 2 weeks for BP  Aldosterone and renin collected today

## 2022-06-22 ENCOUNTER — Other Ambulatory Visit: Payer: Self-pay | Admitting: Family Medicine

## 2022-06-22 LAB — COMPREHENSIVE METABOLIC PANEL
ALT: 30 IU/L (ref 0–32)
AST: 69 IU/L — ABNORMAL HIGH (ref 0–40)
Albumin/Globulin Ratio: 1.8 (ref 1.2–2.2)
Albumin: 4.8 g/dL (ref 3.9–4.9)
Alkaline Phosphatase: 120 IU/L (ref 44–121)
BUN/Creatinine Ratio: 9 (ref 9–23)
BUN: 6 mg/dL (ref 6–24)
Bilirubin Total: 0.6 mg/dL (ref 0.0–1.2)
CO2: 24 mmol/L (ref 20–29)
Calcium: 10.3 mg/dL — ABNORMAL HIGH (ref 8.7–10.2)
Chloride: 100 mmol/L (ref 96–106)
Creatinine, Ser: 0.69 mg/dL (ref 0.57–1.00)
Globulin, Total: 2.6 g/dL (ref 1.5–4.5)
Glucose: 93 mg/dL (ref 70–99)
Potassium: 3.9 mmol/L (ref 3.5–5.2)
Sodium: 142 mmol/L (ref 134–144)
Total Protein: 7.4 g/dL (ref 6.0–8.5)
eGFR: 106 mL/min/{1.73_m2} (ref >59)

## 2022-06-22 LAB — LIPID PANEL
Chol/HDL Ratio: 4.9 ratio — ABNORMAL HIGH (ref 0.0–4.4)
Cholesterol, Total: 231 mg/dL — ABNORMAL HIGH (ref 100–199)
HDL: 47 mg/dL (ref >39)
LDL Chol Calc (NIH): 138 mg/dL — ABNORMAL HIGH (ref 0–99)
Triglycerides: 257 mg/dL — ABNORMAL HIGH (ref 0–149)
VLDL Cholesterol Cal: 46 mg/dL — ABNORMAL HIGH (ref 5–40)

## 2022-06-22 LAB — TSH+FREE T4
Free T4: 1.14 ng/dL (ref 0.82–1.77)
TSH: 1.64 u[IU]/mL (ref 0.450–4.500)

## 2022-06-22 LAB — ALDOSTERONE + RENIN ACTIVITY W/ RATIO
Aldos/Renin Ratio: 6.1 (ref 0.0–30.0)
Aldosterone: 11.6 ng/dL (ref 0.0–30.0)
Renin Activity, Plasma: 1.901 ng/mL/hr (ref 0.167–5.380)

## 2022-06-22 MED ORDER — ROSUVASTATIN CALCIUM 20 MG PO TABS: 20.0000 mg | ORAL_TABLET | Freq: Every day | ORAL | 3 refills | Status: DC

## 2022-06-29 ENCOUNTER — Ambulatory Visit: Payer: BC Managed Care – PPO | Admitting: Family Medicine

## 2022-06-29 ENCOUNTER — Encounter: Payer: Self-pay | Admitting: Family Medicine

## 2022-06-29 VITALS — BP 125/79 | HR 85 | Temp 97.2°F | Resp 16 | Wt 157.9 lb

## 2022-06-29 DIAGNOSIS — T466X5A Adverse effect of antihyperlipidemic and antiarteriosclerotic drugs, initial encounter: Secondary | ICD-10-CM | POA: Diagnosis not present

## 2022-06-29 DIAGNOSIS — Z1211 Encounter for screening for malignant neoplasm of colon: Secondary | ICD-10-CM

## 2022-06-29 DIAGNOSIS — M791 Myalgia, unspecified site: Secondary | ICD-10-CM | POA: Diagnosis not present

## 2022-06-29 DIAGNOSIS — Z87891 Personal history of nicotine dependence: Secondary | ICD-10-CM

## 2022-06-29 DIAGNOSIS — I10 Essential (primary) hypertension: Secondary | ICD-10-CM | POA: Diagnosis not present

## 2022-06-29 DIAGNOSIS — E782 Mixed hyperlipidemia: Secondary | ICD-10-CM | POA: Diagnosis not present

## 2022-06-29 DIAGNOSIS — F17200 Nicotine dependence, unspecified, uncomplicated: Secondary | ICD-10-CM | POA: Insufficient documentation

## 2022-06-29 MED ORDER — EZETIMIBE 10 MG PO TABS: 10.0000 mg | ORAL_TABLET | Freq: Every day | ORAL | 3 refills | Status: DC

## 2022-06-29 MED ORDER — ROSUVASTATIN CALCIUM 5 MG PO TABS: 20.0000 mg | ORAL_TABLET | Freq: Every day | ORAL | Status: DC

## 2022-06-29 NOTE — Assessment & Plan Note (Signed)
Controlled BP at goal Continue current medications at current doses No medications changes today   

## 2022-06-29 NOTE — Assessment & Plan Note (Signed)
Patient is former smoker with reported hx of smoking for 20 years and meets criteria for lung cancer screening  Patient was counseled on importance of annual lung cancer screening based on risk level and was in agreement with referral  Referral for low dose chest CT placed today

## 2022-06-29 NOTE — Assessment & Plan Note (Signed)
Chronically elevated TG and LDL  Unable to tolerate increased intensity of statin, crestor 20mg  decreased to 5mg  daily  Will add zetia 10mg  and plan to recheck lipids in August 2024

## 2022-06-29 NOTE — Assessment & Plan Note (Signed)
Patient reports having cologuard screening kit available at home

## 2022-06-29 NOTE — Patient Instructions (Signed)
A referral has been placed on your behalf for Lung cancer screening, low dose chest CT. Our referral coordination team or the office you will be visiting will contact you within the next 2 weeks.  If you have not received a phone call within 10 business days please let us know so that we can check into this for you.    Your blood pressure is at goal, please continue your current medications   We will follow up with results of labs once they are available.    Please start Zetia 10mg  daily along with restarting 5mg  of the crestor for your cholesterol. We will plan to recheck your levels in August

## 2022-06-29 NOTE — Progress Notes (Signed)
I,Joseline E Rosas,acting as a scribe for [employer], MD.,have documented all relevant documentation on the behalf of Ronnald Ramp, MD,as directed by  Ronnald Ramp, MD while in the presence of Ronnald Ramp, MD.   Established patient visit   Patient: Diane Hahn   DOB: [birth year]   52 y.o. Female  MRN: [id number] Visit Date: 06/29/2022  Today's healthcare provider: Ronnald Ramp, MD   Chief Complaint  Patient presents with   Follow-up   Subjective    HPI  Hypertension, follow-up  BP Readings from Last 3 Encounters:  06/29/22 125/79  06/15/22 (!) 164/90  04/30/22 (!) 158/81   Wt Readings from Last 3 Encounters:  06/29/22 157 lb 14.4 oz (71.6 kg)  06/15/22 160 lb (72.6 kg)  04/30/22 156 lb 9.6 oz (71 kg)     She was last seen for hypertension 2 weeks ago.  BP at that visit was 164/90. Management since that visit includes added HCTZ 12.5mg  and continue amlodipine 10mg  daily along with recording BP.  She reports excellent compliance with treatment. She is not having side effects.   Reports that felt dizzy yesterday. Outside blood pressures are 110's/69-94/60.   Pertinent labs Lab Results  Component Value Date   CHOL 231 (H) 06/15/2022   HDL 47 06/15/2022   LDLCALC 138 (H) 06/15/2022   LDLDIRECT 112.0 07/15/2015   TRIG 257 (H) 06/15/2022   CHOLHDL 4.9 (H) 06/15/2022   Lab Results  Component Value Date   NA 142 06/15/2022   K 3.9 06/15/2022   CREATININE 0.69 06/15/2022   EGFR 106 06/15/2022   GLUCOSE 93 06/15/2022   TSH 1.640 06/15/2022     The 10-year ASCVD risk score (Arnett DK, et al., 2019) is: 6.6%  --------------------------------------------------------------------------------------------------- HLD  statin Myalgias: Patient reports that she is having muscle cramps with the rosuvastatin 20 mg. Reports that she cut it to 10 mg and still having the muscle cramps.  Health  Maintenance cologuard screening ordered. Patient reports that she has it at home. Recommended lung cancer screening based on smoking history of 34 years off and on smoking 1/2 PPD  Medications: Outpatient Medications Prior to Visit  Medication Sig   amLODipine (NORVASC) 10 MG tablet Take 10 mg by mouth daily.   desloratadine (CLARINEX) 5 MG tablet TAKE 1 TABLET BY MOUTH AS NEEDED.   EPINEPHrine (EPIPEN 2-PAK) 0.3 mg/0.3 mL SOAJ injection Use as directed   escitalopram (LEXAPRO) 20 MG tablet TAKE 1 TABLET DAILY. MUST ESTABLISH WITH NEW PRIMARY CARE PHYSICIAN BEFORE FURTHER REFILLS   hydrochlorothiazide (MICROZIDE) 12.5 MG capsule Take 1 capsule (12.5 mg total) by mouth daily.   levothyroxine (SYNTHROID) 112 MCG tablet Take 1 tablet (112 mcg total) by mouth daily.   Aluminum Chloride Hexahydrate CRYS Apply nightly to underarms at bedtime, may decrease to twice weekly once excessive sweating has decreased   [DISCONTINUED] rosuvastatin (CRESTOR) 20 MG tablet Take 1 tablet (20 mg total) by mouth daily.   Facility-Administered Medications Prior to Visit  Medication Dose Route Frequency Provider   betamethasone acetate-betamethasone sodium phosphate (CELESTONE) injection 12 mg  12 mg Intramuscular Once Felecia Shelling, DPM    Review of Systems     Objective    BP 125/79 (BP Location: Left Arm, Patient Position: Sitting, Cuff Size: Normal) Comment: with out BP meds  Pulse 85   Temp (!) 97.2 F (36.2 C) (Oral)   Resp 16   Wt 157 lb 14.4 oz (71.6 kg)   BMI 25.49 kg/m  Physical Exam Vitals reviewed.  Constitutional:      General: She is not in acute distress.    Appearance: Normal appearance. She is not ill-appearing, toxic-appearing or diaphoretic.  Eyes:     Conjunctiva/sclera: Conjunctivae normal.  Cardiovascular:     Rate and Rhythm: Normal rate and regular rhythm.     Pulses: Normal pulses.     Heart sounds: Normal heart sounds. No murmur heard.    No friction rub. No  gallop.  Pulmonary:     Effort: Pulmonary effort is normal. No respiratory distress.     Breath sounds: Normal breath sounds. No stridor. No wheezing, rhonchi or rales.  Abdominal:     General: Bowel sounds are normal. There is no distension.     Palpations: Abdomen is soft.     Tenderness: There is no abdominal tenderness.  Musculoskeletal:     Right lower leg: No edema.     Left lower leg: No edema.  Skin:    Findings: No erythema or rash.  Neurological:     Mental Status: She is alert and oriented to person, place, and time.      No results found for any visits on 06/29/22.  Assessment & Plan     Problem List Items Addressed This Visit       Cardiovascular and Mediastinum   Hypertension - Primary    Controlled BP at goal Continue current medications at current doses No medications changes today        Relevant Medications   ezetimibe (ZETIA) 10 MG tablet   rosuvastatin (CRESTOR) 5 MG tablet     Other   Hyperlipidemia    Chronically elevated TG and LDL  Unable to tolerate increased intensity of statin, crestor 20mg  decreased to 5mg  daily  Will add zetia 10mg  and plan to recheck lipids in August 2024       Relevant Medications   ezetimibe (ZETIA) 10 MG tablet   rosuvastatin (CRESTOR) 5 MG tablet   Screening for colon cancer    Patient reports having cologuard screening kit available at home       Former light cigarette smoker (1-9 per day)    Patient is former smoker with reported hx of smoking for 20 years and meets criteria for lung cancer screening  Patient was counseled on importance of annual lung cancer screening based on risk level and was in agreement with referral  Referral for low dose chest CT placed today         Relevant Orders   Ambulatory Referral Lung Cancer Screening [facility]   Other Visit Diagnoses     Myalgia due to statin       Relevant Orders   CK (Creatine Kinase)   Comprehensive metabolic panel        Return in  about 3 months (around 09/29/2022) for HLD f/u .       The entirety of the information documented in the History of Present Illness, Review of Systems and Physical Exam were personally obtained by me. Portions of this information were initially documented by Hetty Ely, CMA. I, Ronnald Ramp, MD have reviewed the documentation above for thoroughness and accuracy.   Ronnald Ramp, MD  [facility] [phone] (phone) [phone] (fax)  [facility]

## 2022-06-30 LAB — SPECIMEN STATUS

## 2022-07-03 LAB — COMPREHENSIVE METABOLIC PANEL WITH GFR
ALT: 52 [IU]/L — ABNORMAL HIGH (ref 0–32)
AST: 122 [IU]/L — ABNORMAL HIGH (ref 0–40)
Albumin/Globulin Ratio: 1.9 (ref 1.2–2.2)
Albumin: 5.1 g/dL — ABNORMAL HIGH (ref 3.9–4.9)
Alkaline Phosphatase: 119 [IU]/L (ref 44–121)
BUN/Creatinine Ratio: 7 — ABNORMAL LOW (ref 9–23)
BUN: 5 mg/dL — ABNORMAL LOW (ref 6–24)
Bilirubin Total: 0.6 mg/dL (ref 0.0–1.2)
CO2: 24 mmol/L (ref 20–29)
Calcium: 10.1 mg/dL (ref 8.7–10.2)
Chloride: 98 mmol/L (ref 96–106)
Creatinine, Ser: 0.71 mg/dL (ref 0.57–1.00)
Globulin, Total: 2.7 g/dL (ref 1.5–4.5)
Glucose: 103 mg/dL — ABNORMAL HIGH (ref 70–99)
Potassium: 3.6 mmol/L (ref 3.5–5.2)
Sodium: 143 mmol/L (ref 134–144)
Total Protein: 7.8 g/dL (ref 6.0–8.5)
eGFR: 104 mL/min/{1.73_m2}

## 2022-07-03 LAB — SPECIMEN STATUS REPORT

## 2022-07-03 LAB — CK: Total CK: 55 U/L (ref 32–182)

## 2022-08-13 ENCOUNTER — Encounter: Payer: Self-pay | Admitting: Family Medicine

## 2022-09-29 ENCOUNTER — Encounter: Payer: Self-pay | Admitting: Family Medicine

## 2022-09-29 ENCOUNTER — Ambulatory Visit: Payer: BC Managed Care – PPO | Admitting: Family Medicine

## 2022-09-29 VITALS — BP 125/84 | HR 59 | Temp 98.0°F | Resp 12 | Ht 66.0 in | Wt 152.6 lb

## 2022-09-29 DIAGNOSIS — I10 Essential (primary) hypertension: Secondary | ICD-10-CM | POA: Diagnosis not present

## 2022-09-29 DIAGNOSIS — N951 Menopausal and female climacteric states: Secondary | ICD-10-CM

## 2022-09-29 DIAGNOSIS — E782 Mixed hyperlipidemia: Secondary | ICD-10-CM

## 2022-09-29 MED ORDER — GABAPENTIN 100 MG PO CAPS: 100.0000 mg | ORAL_CAPSULE | Freq: Every day | ORAL | 1 refills | Status: DC

## 2022-09-29 NOTE — Assessment & Plan Note (Signed)
Experiencing symptoms of depression, hot flashes, and night sweats. Patient prefers natural remedies and is using essential oils and considering evening primrose. -chronic, symptoms not well controlled with lifestyle modifications  -Start Gabapentin 100mg  at bedtime to help with symptoms. Ok to titrate up to 300mg  TID depending on effect of meds on somnolence, counseled pt on sedating effects, pt voiced understanding  -Reevaluate in November.

## 2022-09-29 NOTE — Assessment & Plan Note (Signed)
Patient has stopped taking Zetia 10mg  due to side effects. Currently trying a supplement called Cholesterol Off and fish oil, along with dietary modifications. Chronic, lipid panel levels are not within goal range. Pt unable to tolerate statins nor zetia due to SE  -Discontinue Zetia 10mg . -Check lipid panel around Thanksgiving to assess the effect of the new regimen.

## 2022-09-29 NOTE — Patient Instructions (Addendum)
Evening Primrose for menopause   VISIT SUMMARY:  During our visit, we discussed your hypertension, hyperlipidemia, menopause symptoms, weight management, and liver function. You've been managing your blood pressure well without the use of Hydrochlorothiazide and should continue taking Amlodipine. You've stopped taking Zetia for your cholesterol due to side effects and have started using a supplement called Cholesterol Off and fish oil. We will check your cholesterol levels in November to see how this new regimen is working. You've been experiencing menopause symptoms and prefer natural remedies. I've suggested starting Gabapentin to help with these symptoms and we will reevaluate in November. You've nearly reached your weight loss goal, which is fantastic. Keep up the good work! Lastly, we will check your liver function in November to see how the milk thistle supplement is working.  YOUR PLAN:  -HYPERTENSION: Hypertension is high blood pressure. You've been managing it well without Hydrochlorothiazide. Continue taking Amlodipine daily.  -HYPERLIPIDEMIA: Hyperlipidemia is high cholesterol. You've stopped taking Zetia due to side effects and started a new regimen. We will check your cholesterol levels in November.  -MENOPAUSE: You've been experiencing symptoms of menopause and prefer natural remedies. I've suggested starting Gabapentin to help with these symptoms. We will reevaluate in November.  -WEIGHT MANAGEMENT: You've nearly reached your weight loss goal, which is fantastic. Keep up the good work!  -LIVER FUNCTION: You've been taking milk thistle for your liver function. We will check your liver function in November to see how the supplement is working.  INSTRUCTIONS:  Please continue taking Amlodipine daily for your hypertension. For your cholesterol, discontinue Zetia and continue with your new regimen of Cholesterol Off and fish oil. We will check your cholesterol levels in November. For  your menopause symptoms, start taking Gabapentin 100mg  at bedtime. We will reevaluate this in November. Keep up your healthy habits for weight management. Lastly, continue taking milk thistle for your liver function and we will check your liver function in November.

## 2022-09-29 NOTE — Assessment & Plan Note (Signed)
Blood pressure controlled at 125/84. Patient has stopped taking Hydrochlorothiazide 12.5mg  and is currently on Amlodipine 10mg  daily. -Chronic, diastolic slightly above goal of less than 80  -Continue Amlodipine 10mg  daily.

## 2022-09-29 NOTE — Progress Notes (Signed)
Established patient visit   Patient: Diane Hahn   DOB: 11/29/1970   52 y.o. Female  MRN: [id number] Visit Date: 09/29/2022  Today's healthcare provider: Ronnald Ramp, MD   Chief Complaint  Patient presents with   Medical Management of Chronic Issues    Patient reports taking ezetimibe and hydrochlorothiazide 3-4 days only , due to leg pain, dizziness and didn't feel right. She reports good BP reading at home. Still dropping.    Subjective     HPI     Medical Management of Chronic Issues    Additional comments: Patient reports taking ezetimibe and hydrochlorothiazide 3-4 days only , due to leg pain, dizziness and didn't feel right. She reports good BP reading at home. Still dropping.       Last edited by Myles Lipps, CMA on 09/29/2022 10:17 AM.       Discussed the use of AI scribe software for clinical note transcription with the patient, who gave verbal consent to proceed.  History of Present Illness   The patient, with a history of hypertension and hyperlipidemia, reports a stable blood pressure of 125/84 without the use of hydrochlorothiazide 12.5 mg. She continues to take amlodipine 10 mg daily. She has stopped taking Zetia 10 mg due to adverse effects including leg and eye aches.  In an attempt to manage her cholesterol, the patient has started a supplement called Cholesterol Off and fish oil, while also making dietary adjustments such as reducing fried and fast food intake. She has been taking these supplements for approximately three weeks at the time of the consultation.  The patient also reports symptoms of menopause, including depression, lack of interest, hot flashes, and night sweats. These symptoms are intermittent but appear to be increasing in intensity. She has been managing these symptoms with natural remedies and essential oils, and is not currently taking any medication for menopause.  The patient also mentions a weight loss goal,  which she has nearly achieved according to her home scale. She reports a stable mood with the use of Lexapro, which she refers to as her "happy pill."         Medications: Outpatient Medications Prior to Visit  Medication Sig   Aluminum Chloride Hexahydrate CRYS Apply nightly to underarms at bedtime, may decrease to twice weekly once excessive sweating has decreased   amLODipine (NORVASC) 10 MG tablet Take 10 mg by mouth daily.   desloratadine (CLARINEX) 5 MG tablet TAKE 1 TABLET BY MOUTH AS NEEDED.   EPINEPHrine (EPIPEN 2-PAK) 0.3 mg/0.3 mL SOAJ injection Use as directed   escitalopram (LEXAPRO) 20 MG tablet TAKE 1 TABLET DAILY. MUST ESTABLISH WITH NEW PRIMARY CARE PHYSICIAN BEFORE FURTHER REFILLS   levothyroxine (SYNTHROID) 112 MCG tablet Take 1 tablet (112 mcg total) by mouth daily.   rosuvastatin (CRESTOR) 5 MG tablet Take 4 tablets (20 mg total) by mouth daily.   hydrochlorothiazide (MICROZIDE) 12.5 MG capsule Take 1 capsule (12.5 mg total) by mouth daily. (Patient not taking: Reported on 09/29/2022)   [DISCONTINUED] ezetimibe (ZETIA) 10 MG tablet Take 1 tablet (10 mg total) by mouth daily. (Patient not taking: Reported on 09/29/2022)   Facility-Administered Medications Prior to Visit  Medication Dose Route Frequency Provider   betamethasone acetate-betamethasone sodium phosphate (CELESTONE) injection 12 mg  12 mg Intramuscular Once Felecia Shelling, DPM    Review of Systems  Last lipids Lab Results  Component Value Date   CHOL 231 (H) 06/15/2022   HDL 47  06/15/2022   LDLCALC 138 (H) 06/15/2022   LDLDIRECT 112.0 07/15/2015   TRIG 257 (H) 06/15/2022   CHOLHDL 4.9 (H) 06/15/2022        Objective    BP 125/84 (BP Location: Right Arm, Patient Position: Sitting, Cuff Size: Normal)   Pulse (!) 59   Temp 98 F (36.7 C) (Temporal)   Resp 12   Ht 5\' 6"  (1.676 m)   Wt 152 lb 9.6 oz (69.2 kg)   SpO2 97%   BMI 24.63 kg/m     Physical Exam Vitals reviewed.  Constitutional:       General: She is not in acute distress.    Appearance: Normal appearance. She is not ill-appearing, toxic-appearing or diaphoretic.  Eyes:     Conjunctiva/sclera: Conjunctivae normal.  Cardiovascular:     Rate and Rhythm: Normal rate and regular rhythm.     Pulses: Normal pulses.     Heart sounds: Normal heart sounds. No murmur heard.    No friction rub. No gallop.  Pulmonary:     Effort: Pulmonary effort is normal. No respiratory distress.     Breath sounds: Normal breath sounds. No stridor. No wheezing, rhonchi or rales.  Abdominal:     General: Bowel sounds are normal. There is no distension.     Palpations: Abdomen is soft.     Tenderness: There is no abdominal tenderness.  Musculoskeletal:     Right lower leg: No edema.     Left lower leg: No edema.  Skin:    Findings: No erythema or rash.  Neurological:     Mental Status: She is alert and oriented to person, place, and time.       No results found for any visits on 09/29/22.  Assessment & Plan     Problem List Items Addressed This Visit     Hyperlipidemia - Primary    Patient has stopped taking Zetia 10mg  due to side effects. Currently trying a supplement called Cholesterol Off and fish oil, along with dietary modifications. Chronic, lipid panel levels are not within goal range. Pt unable to tolerate statins nor zetia due to SE  -Discontinue Zetia 10mg . -Check lipid panel around Thanksgiving to assess the effect of the new regimen.      Hypertension    Blood pressure controlled at 125/84. Patient has stopped taking Hydrochlorothiazide 12.5mg  and is currently on Amlodipine 10mg  daily. -Chronic, diastolic slightly above goal of less than 80  -Continue Amlodipine 10mg  daily.      Vasomotor symptoms due to menopause    Experiencing symptoms of depression, hot flashes, and night sweats. Patient prefers natural remedies and is using essential oils and considering evening primrose. -chronic, symptoms not well  controlled with lifestyle modifications  -Start Gabapentin 100mg  at bedtime to help with symptoms. Ok to titrate up to 300mg  TID depending on effect of meds on somnolence, counseled pt on sedating effects, pt voiced understanding  -Reevaluate in November.      Relevant Medications   gabapentin (NEURONTIN) 100 MG capsule     Weight Management Patient has reached her goal weight according to her home scale. -Congratulate on weight loss achievement and encourage maintenance of healthy habits.  Liver Function Elevated liver enzymes in May. Patient is taking milk thistle for liver function. -Check liver function tests in November to assess response to supplements.      Return in about 3 months (around 12/30/2022) for Cholesterol, HTN.       Ronnald Ramp, MD  [facility] [phone] (phone) [phone] (fax)  [facility]

## 2022-10-06 IMAGING — MG MM DIGITAL SCREENING BILAT W/ TOMO AND CAD
8 series · 8 of 24 positions shown · non-contrast
Comparison: Previous exam(s).

CLINICAL DATA: Screening.

EXAM:
DIGITAL SCREENING BILATERAL MAMMOGRAM WITH TOMOSYNTHESIS AND CAD
TECHNIQUE: Bilateral screening digital craniocaudal and mediolateral oblique
mammograms were obtained. Bilateral screening digital breast
tomosynthesis was performed. The images were evaluated with
computer-aided detection.

[L MLO synth-2D]
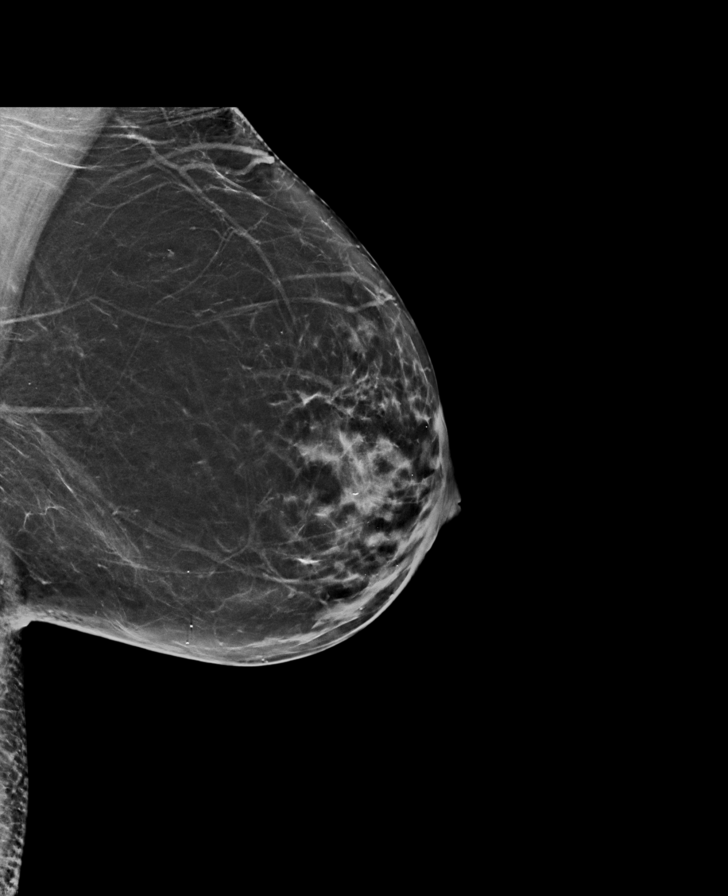

[R CC synth-2D]
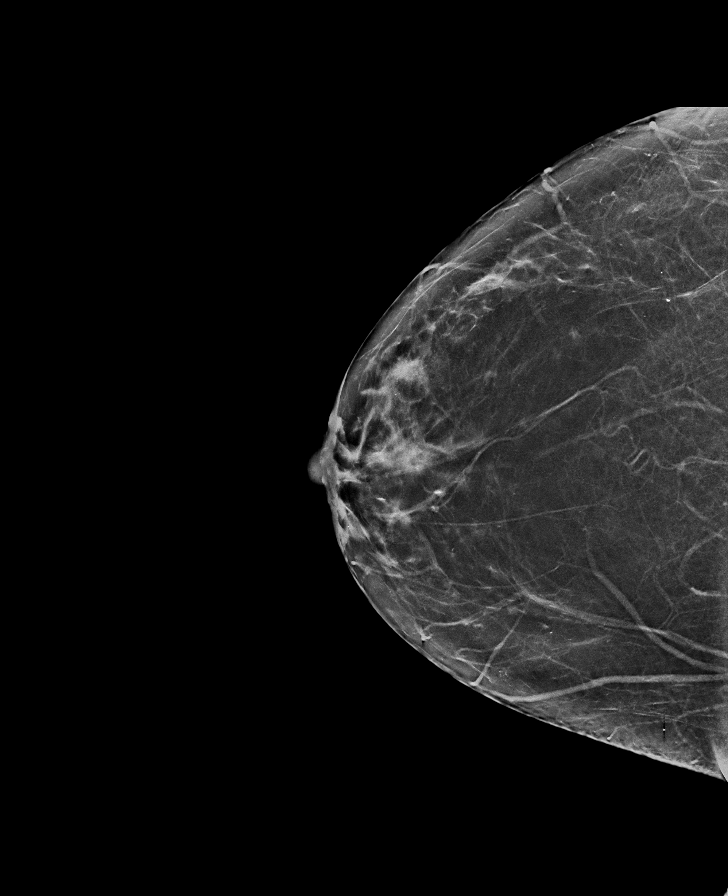

[R MLO synth-2D]
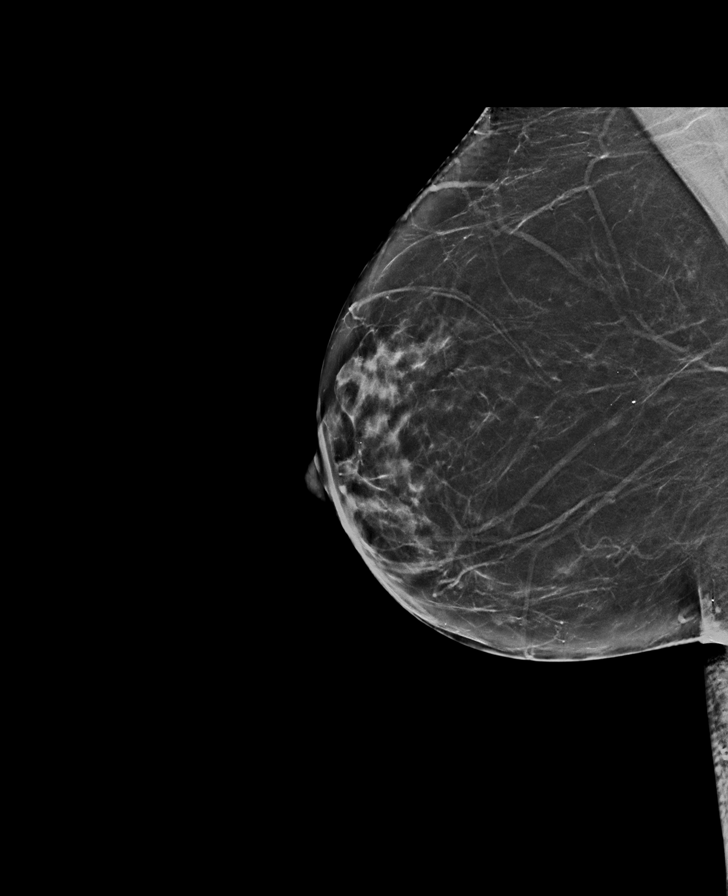

[L CC synth-2D]
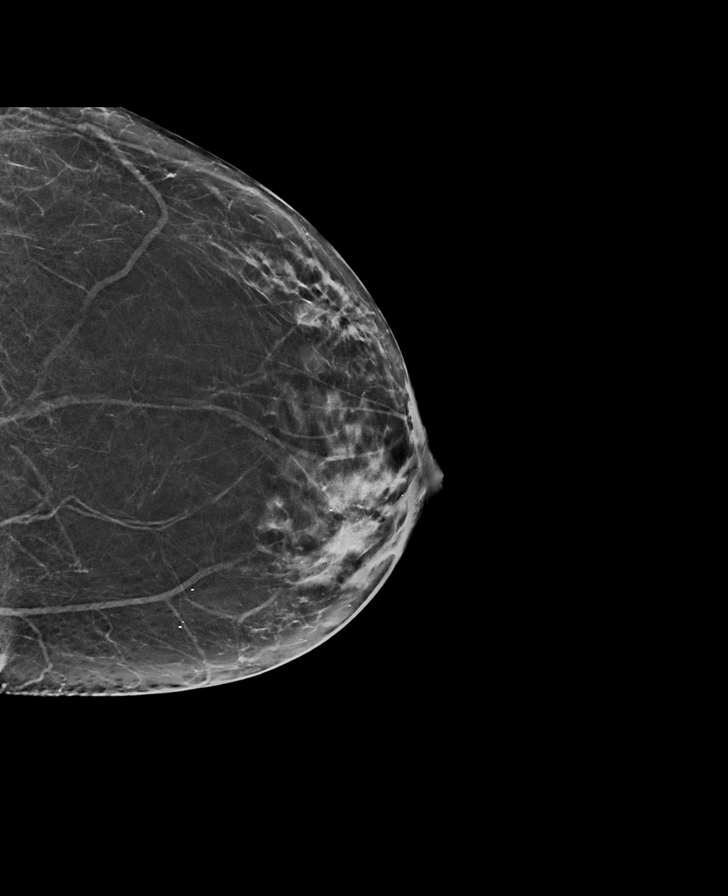

[L MLO tomo · tomo slice 35/70.0]
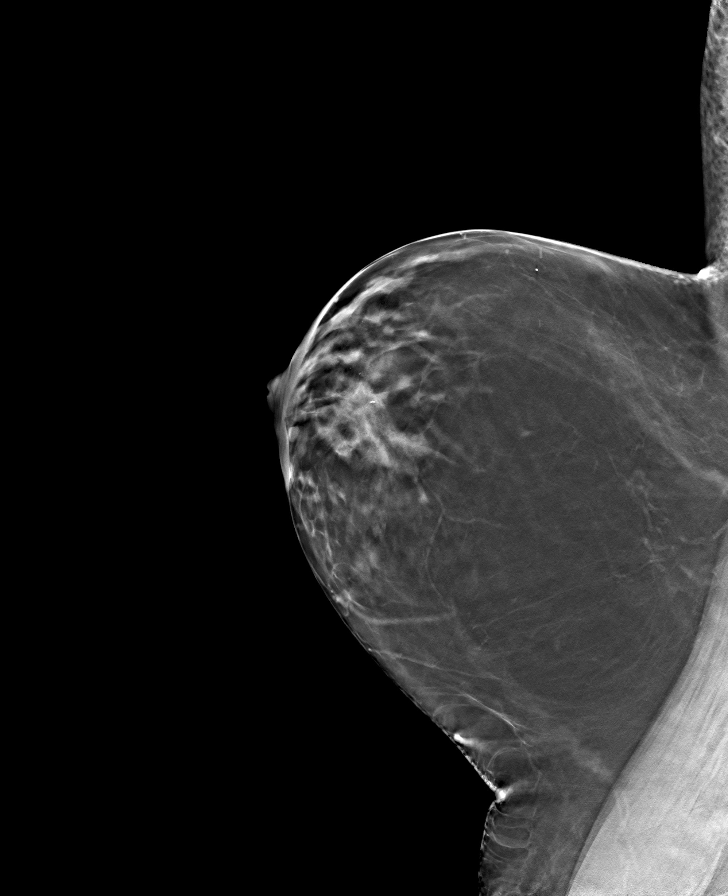

[R CC tomo · tomo slice 33/64.0]
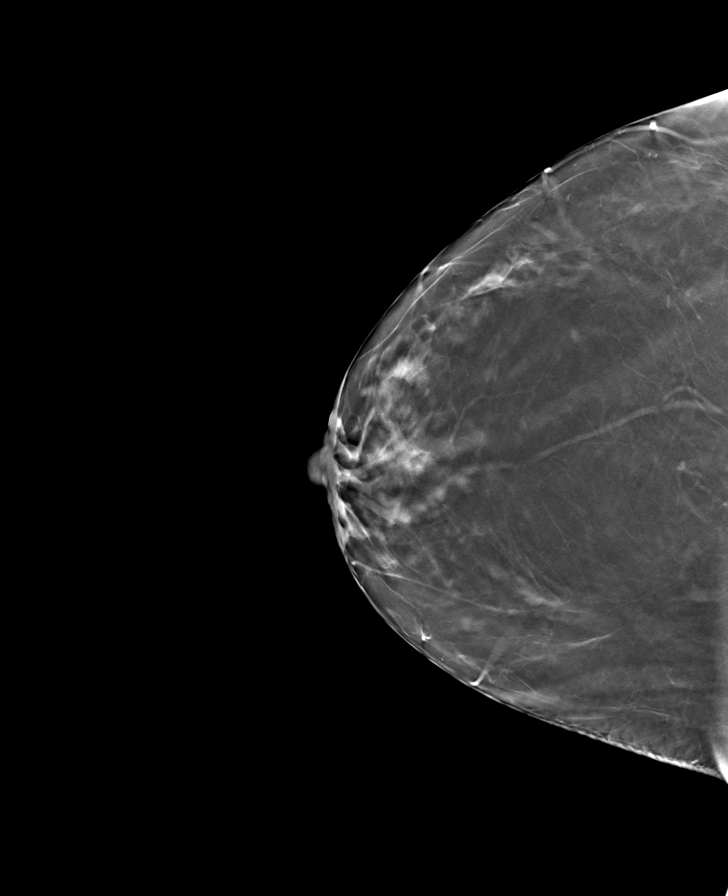

[L CC tomo · tomo slice 33/65.0]
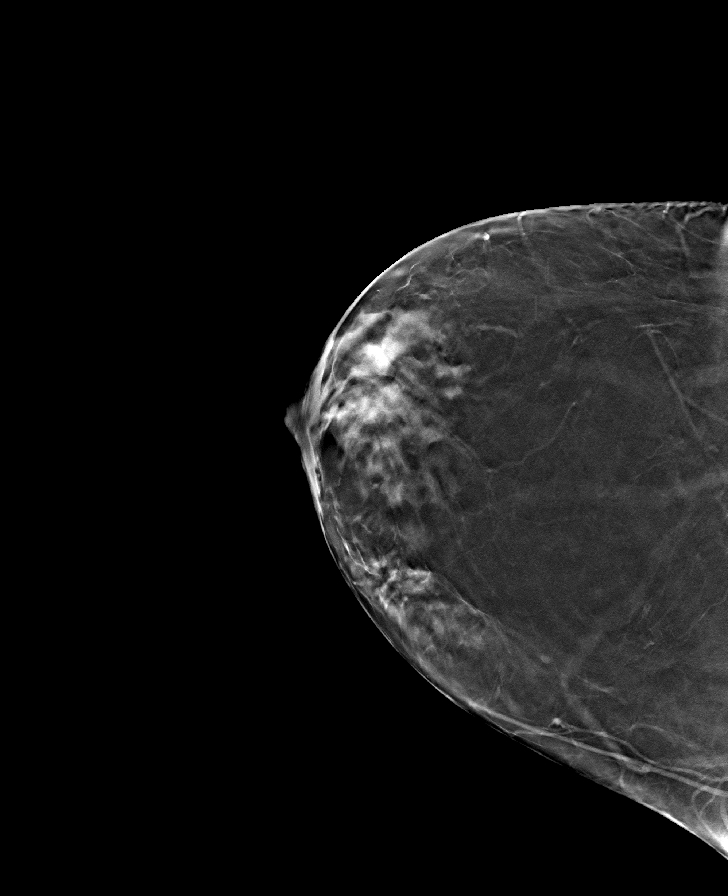

[R MLO tomo · tomo slice 35/70.0]
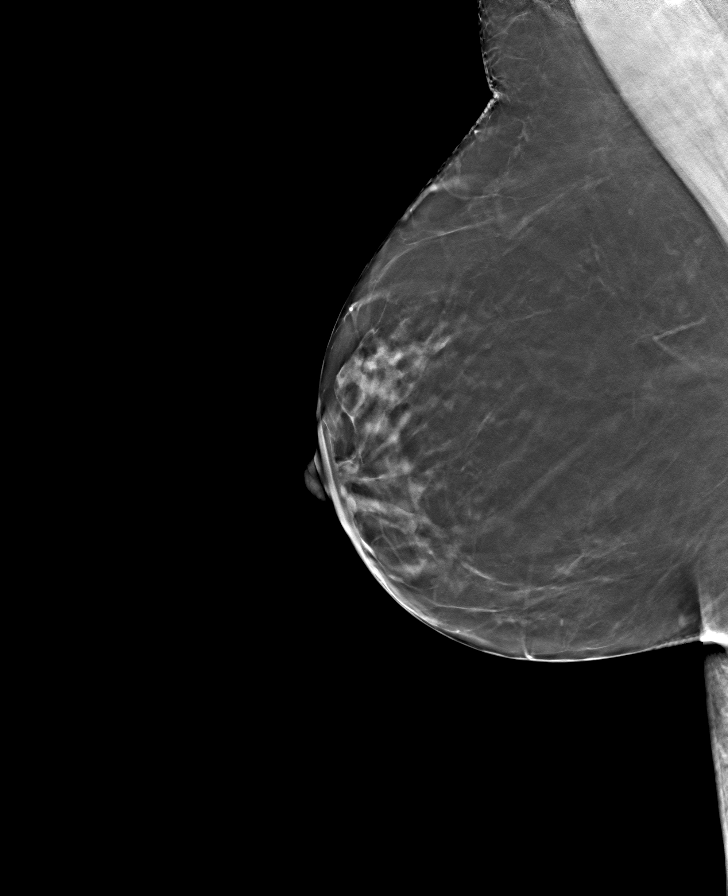

[8 of 24 positions shown; findings below may reference images not displayed]

ACR Breast Density Category b: There are scattered areas of
fibroglandular density.
FINDINGS: There are no findings suspicious for malignancy. The images were
evaluated with computer-aided detection.
IMPRESSION: No mammographic evidence of malignancy. A result letter of this
screening mammogram will be mailed directly to the patient.

RECOMMENDATION:
Screening mammogram in one year. (Code:[id number]-I-BG6)

BI-RADS CATEGORY  1: Negative.

## 2022-10-09 ENCOUNTER — Other Ambulatory Visit: Payer: Self-pay | Admitting: Family Medicine

## 2022-10-09 DIAGNOSIS — E079 Disorder of thyroid, unspecified: Secondary | ICD-10-CM

## 2022-10-27 ENCOUNTER — Ambulatory Visit: Payer: BC Managed Care – PPO

## 2022-11-09 DIAGNOSIS — Z760 Encounter for issue of repeat prescription: Secondary | ICD-10-CM | POA: Diagnosis not present

## 2022-11-09 DIAGNOSIS — Z6824 Body mass index (BMI) 24.0-24.9, adult: Secondary | ICD-10-CM | POA: Diagnosis not present

## 2022-11-09 DIAGNOSIS — I10 Essential (primary) hypertension: Secondary | ICD-10-CM | POA: Diagnosis not present

## 2022-11-09 DIAGNOSIS — R300 Dysuria: Secondary | ICD-10-CM | POA: Diagnosis not present

## 2022-11-18 ENCOUNTER — Other Ambulatory Visit: Payer: Self-pay | Admitting: Family Medicine

## 2022-11-18 ENCOUNTER — Encounter: Payer: Self-pay | Admitting: Family Medicine

## 2022-11-18 DIAGNOSIS — Z1231 Encounter for screening mammogram for malignant neoplasm of breast: Secondary | ICD-10-CM

## 2022-12-29 NOTE — Progress Notes (Unsigned)
      Established patient visit   Patient: Diane Hahn   DOB: 09/15/1970   52 y.o. Female  MRN: [id number] Visit Date: 12/30/2022  Today's healthcare provider: Ronnald Ramp, MD   No chief complaint on file.  Subjective       Discussed the use of AI scribe software for clinical note transcription with the patient, who gave verbal consent to proceed.  History of Present Illness          Cologuard ordered in May, been able to return kit yet? ***   Past Medical History:  Diagnosis Date   Allergy    Allergy to alpha-gal    HLD (hyperlipidemia)    Hypertension    Panic attack    Positive TB test    Thyroid disease     Medications: Outpatient Medications Prior to Visit  Medication Sig   Aluminum Chloride Hexahydrate CRYS Apply nightly to underarms at bedtime, may decrease to twice weekly once excessive sweating has decreased   amLODipine (NORVASC) 10 MG tablet Take 10 mg by mouth daily.   desloratadine (CLARINEX) 5 MG tablet TAKE 1 TABLET BY MOUTH AS NEEDED.   EPINEPHrine (EPIPEN 2-PAK) 0.3 mg/0.3 mL SOAJ injection Use as directed   escitalopram (LEXAPRO) 20 MG tablet TAKE 1 TABLET DAILY. MUST ESTABLISH WITH NEW PRIMARY CARE PHYSICIAN BEFORE FURTHER REFILLS   gabapentin (NEURONTIN) 100 MG capsule Take 1 capsule (100 mg total) by mouth at bedtime.   hydrochlorothiazide (MICROZIDE) 12.5 MG capsule Take 1 capsule (12.5 mg total) by mouth daily. (Patient not taking: Reported on 09/29/2022)   levothyroxine (SYNTHROID) 112 MCG tablet TAKE 1 TABLET BY MOUTH EVERY DAY   rosuvastatin (CRESTOR) 5 MG tablet Take 4 tablets (20 mg total) by mouth daily.   Facility-Administered Medications Prior to Visit  Medication Dose Route Frequency Provider   betamethasone acetate-betamethasone sodium phosphate (CELESTONE) injection 12 mg  12 mg Intramuscular Once Felecia Shelling, DPM    Review of Systems  {Insert previous labs (optional):23779} {See past labs  Heme  Chem   Endocrine  Serology  Results Review (optional):1}   Objective    There were no vitals taken for this visit. {Insert last BP/Wt (optional):23777}{See vitals history (optional):1}      Physical Exam  ***  No results found for any visits on 12/30/22.  Assessment & Plan     Problem List Items Addressed This Visit   None   Assessment and Plan              No follow-ups on file.         Ronnald Ramp, MD  [facility] [phone] (phone) [phone] (fax)  [facility]

## 2022-12-30 ENCOUNTER — Telehealth: Payer: Self-pay | Admitting: Family Medicine

## 2022-12-30 ENCOUNTER — Encounter: Payer: Self-pay | Admitting: Family Medicine

## 2022-12-30 ENCOUNTER — Ambulatory Visit: Payer: BC Managed Care – PPO | Admitting: Family Medicine

## 2022-12-30 ENCOUNTER — Other Ambulatory Visit: Payer: Self-pay | Admitting: Family Medicine

## 2022-12-30 VITALS — BP 130/74 | HR 61 | Resp 18 | Ht 66.0 in | Wt 142.2 lb

## 2022-12-30 DIAGNOSIS — R7401 Elevation of levels of liver transaminase levels: Secondary | ICD-10-CM

## 2022-12-30 DIAGNOSIS — E079 Disorder of thyroid, unspecified: Secondary | ICD-10-CM

## 2022-12-30 DIAGNOSIS — I10 Essential (primary) hypertension: Secondary | ICD-10-CM | POA: Diagnosis not present

## 2022-12-30 DIAGNOSIS — R61 Generalized hyperhidrosis: Secondary | ICD-10-CM

## 2022-12-30 DIAGNOSIS — N951 Menopausal and female climacteric states: Secondary | ICD-10-CM

## 2022-12-30 DIAGNOSIS — Z23 Encounter for immunization: Secondary | ICD-10-CM | POA: Diagnosis not present

## 2022-12-30 DIAGNOSIS — E782 Mixed hyperlipidemia: Secondary | ICD-10-CM | POA: Diagnosis not present

## 2022-12-30 MED ORDER — ALUMINUM CHLORIDE HEXAHYDRATE CRYS: CRYSTALS | 1 refills | Status: DC

## 2022-12-30 NOTE — Patient Instructions (Signed)
VISIT SUMMARY:  Today, we reviewed your cardiovascular health, liver function, and thyroid management. We also discussed general health maintenance, including vaccinations and cancer screening.  YOUR PLAN:  -HYPERLIPIDEMIA: Hyperlipidemia means you have high levels of fats (lipids) in your blood, which can increase your risk of heart disease. Your LDL cholesterol was elevated at 138 mg/dL, and your total cholesterol was 231 mg/dL. We discussed the importance of keeping your LDL under 100 mg/dL and total cholesterol under 200 mg/dL. Continue taking rosuvastatin 5 mg daily, and we will order a new lipid panel to monitor your levels.  -HYPERTENSION: Hypertension is high blood pressure, which can lead to heart disease if not managed properly. Your blood pressure was initially 145/86 mmHg but improved to 130/74 mmHg during the visit. You are currently taking amlodipine 10 mg daily and have reported good home readings and recent weight loss. Continue with your current medication and lifestyle modifications, and we will recheck your blood pressure.  -ELEVATED LIVER ENZYMES: Elevated liver enzymes can indicate liver inflammation or damage. Your AST was 122 U/L and ALT was 52 U/L, likely due to fatty liver changes. We discussed potential causes and the importance of monitoring your liver function while on cholesterol medication. We will order a complete metabolic panel (CMP) to check your liver function.  -HYPOTHYROIDISM: Hypothyroidism is when your thyroid gland doesn't produce enough hormones, leading to a slow metabolism. You are currently taking levothyroxine 112 mcg daily, and your thyroid function is well-controlled. Continue with your current medication, and we will monitor your thyroid function regularly.  -GENERAL HEALTH MAINTENANCE: We discussed the importance of completing your pending Cologuard test for colorectal cancer screening. You received a flu shot today, and we talked about considering  another COVID vaccine. Vaccinations are important for preventing illness and maintaining overall health.  INSTRUCTIONS:  Please follow up on your blood work results and continue to monitor your blood pressure at home. Complete the Cologuard test as soon as possible.

## 2022-12-30 NOTE — Assessment & Plan Note (Signed)
Elevated LDL at 138 mg/dL and total cholesterol at 231 mg/dL from the last lipid panel in May 2024. Currently on rosuvastatin 5 mg. Discussed the importance of maintaining LDL under 100 mg/dL and total cholesterol under 200 mg/dL to reduce cardiovascular risk. Reviewed benefits of medication and lifestyle changes, including diet and exercise. - chronic  - Order lipid panel - Continue rosuvastatin 5 mg

## 2022-12-30 NOTE — Telephone Encounter (Signed)
Pt is calling in because she wants to know if documentation that she had her flu shot could be put in her chart. Pt says she need it for work.

## 2022-12-30 NOTE — Assessment & Plan Note (Signed)
Chronic  Well controlled on aluminum chloride hexahydrate crystals  Continue current regimen  Refills provided for crystals today

## 2022-12-30 NOTE — Assessment & Plan Note (Signed)
Currently on levothyroxine 112 mcg with well-controlled thyroid function. Last thyroid function tests in May 2024 were normal. Discussed importance of regular monitoring. - Continue levothyroxine 112 mcg

## 2022-12-30 NOTE — Assessment & Plan Note (Signed)
Chronic  Reports well controlled since cooler weather started  -continue  Gabapentin 100mg  at bedtime to help with symptoms.

## 2022-12-30 NOTE — Assessment & Plan Note (Signed)
Chronic Blood pressure initially 145/86 mmHg, rechecked at 130/74 mmHg. Currently on amlodipine 10 mg. Reports good home readings and recent weight loss of 10 pounds since August, beneficial for blood pressure control. Emphasized maintaining target blood pressure to reduce cardiovascular risk and importance of lifestyle modifications. - Continue amlodipine 10 mg - Recheck blood pressure

## 2022-12-30 NOTE — Assessment & Plan Note (Signed)
Elevated AST at 122 U/L and ALT at 52 U/L, likely due to fatty liver changes. Discussed potential causes, including medication effects and fatty liver disease. Emphasized monitoring liver function while on cholesterol medication. - Order complete metabolic panel (CMP)

## 2022-12-31 LAB — COMPREHENSIVE METABOLIC PANEL
ALT: 32 [IU]/L (ref 0–32)
AST: 93 [IU]/L — ABNORMAL HIGH (ref 0–40)
Albumin: 4.5 g/dL (ref 3.8–4.9)
Alkaline Phosphatase: 99 [IU]/L (ref 44–121)
BUN/Creatinine Ratio: 6 — ABNORMAL LOW (ref 9–23)
BUN: 4 mg/dL — ABNORMAL LOW (ref 6–24)
Bilirubin Total: 1 mg/dL (ref 0.0–1.2)
CO2: 26 mmol/L (ref 20–29)
Calcium: 9.9 mg/dL (ref 8.7–10.2)
Chloride: 101 mmol/L (ref 96–106)
Creatinine, Ser: 0.72 mg/dL (ref 0.57–1.00)
Globulin, Total: 2.3 g/dL (ref 1.5–4.5)
Glucose: 95 mg/dL (ref 70–99)
Potassium: 3.5 mmol/L (ref 3.5–5.2)
Sodium: 144 mmol/L (ref 134–144)
Total Protein: 6.8 g/dL (ref 6.0–8.5)
eGFR: 101 mL/min/{1.73_m2} (ref >59)

## 2022-12-31 LAB — LIPID PANEL
Chol/HDL Ratio: 2.4 ratio (ref 0.0–4.4)
Cholesterol, Total: 123 mg/dL (ref 100–199)
HDL: 51 mg/dL (ref >39)
LDL Chol Calc (NIH): 54 mg/dL (ref 0–99)
Triglycerides: 95 mg/dL (ref 0–149)
VLDL Cholesterol Cal: 18 mg/dL (ref 5–40)

## 2022-12-31 NOTE — Telephone Encounter (Signed)
Pharmacy comment: Script Clarification:DID YOU MEAN TO PRESCRIBE DRY SOL? WE CANNOT ORDER THE CRYSTALS.

## 2022-12-31 NOTE — Telephone Encounter (Signed)
Pt requested crystals   Pharmacy documentation states that are unable to order crystals   Drysol ordered as replacement   Ronnald Ramp, MD

## 2022-12-31 NOTE — Telephone Encounter (Signed)
Left patient a message on her VM that the information is in her chart

## 2022-12-31 NOTE — Telephone Encounter (Signed)
Patient notified of the change. She verbalized understanding and appreciation.

## 2023-01-10 ENCOUNTER — Encounter: Payer: Self-pay | Admitting: Family Medicine

## 2023-01-12 DIAGNOSIS — Z6822 Body mass index (BMI) 22.0-22.9, adult: Secondary | ICD-10-CM | POA: Diagnosis not present

## 2023-01-12 DIAGNOSIS — J209 Acute bronchitis, unspecified: Secondary | ICD-10-CM | POA: Diagnosis not present

## 2023-01-14 ENCOUNTER — Encounter: Payer: Self-pay | Admitting: Physician Assistant

## 2023-01-14 ENCOUNTER — Ambulatory Visit: Payer: Self-pay | Admitting: *Deleted

## 2023-01-14 ENCOUNTER — Ambulatory Visit: Payer: BC Managed Care – PPO | Admitting: Physician Assistant

## 2023-01-14 VITALS — BP 128/70 | HR 72 | Resp 16 | Ht 66.0 in | Wt 138.0 lb

## 2023-01-14 DIAGNOSIS — J069 Acute upper respiratory infection, unspecified: Secondary | ICD-10-CM

## 2023-01-14 DIAGNOSIS — R062 Wheezing: Secondary | ICD-10-CM | POA: Diagnosis not present

## 2023-01-14 DIAGNOSIS — M940 Chondrocostal junction syndrome [Tietze]: Secondary | ICD-10-CM | POA: Diagnosis not present

## 2023-01-14 MED ORDER — BENZONATATE 100 MG PO CAPS
100.0000 mg | ORAL_CAPSULE | Freq: Two times a day (BID) | ORAL | 0 refills | Status: DC | PRN
Start: 2023-01-14 — End: 2023-06-24

## 2023-01-14 MED ORDER — PREDNISONE 20 MG PO TABS: ORAL_TABLET | ORAL | 0 refills | Status: DC

## 2023-01-14 MED ORDER — DICLOFENAC SODIUM 75 MG PO TBEC
75.0000 mg | DELAYED_RELEASE_TABLET | Freq: Two times a day (BID) | ORAL | 0 refills | Status: DC
Start: 2023-01-14 — End: 2023-04-01

## 2023-01-14 NOTE — Progress Notes (Unsigned)
Acute Office Visit   Patient: Diane Hahn   DOB: 1970-04-13   52 y.o. Female  MRN: [id number] Visit Date: 01/14/2023  Today's healthcare provider: Oswaldo Conroy Kindsey Eblin, PA-C  Introduced myself to the patient as a Secondary school teacher and provided education on APPs in clinical practice.    Chief Complaint  Patient presents with   Cough    Dx w/bronchitis 01/12/23. On steroid and Z-pack. Cough making chest hurt, pain under R arm.   Subjective    HPI HPI     Cough    Additional comments: Dx w/bronchitis 01/12/23. On steroid and Z-pack. Cough making chest hurt, pain under R arm.      Last edited by Dollene Primrose, CMA on 01/14/2023  3:17 PM.      Symptoms started on Friday  Reports similar symptoms in other family members  She states this week she went to [facility] and was started on Zpack and steroid taper to assist with symptoms  She states she is having chest pain on the right side - reports pain is along pectoral and in back  She has had massage therapy - plans to get sling to assist with shoulder pain  She states pain is 18/10 today and gets worse with coughing   She has been using cough drops, mucinex, robitussin  She has been taking Ibuprofen for pain and is still on the steroid taper (thinks she has 2 more days)     Medications: Outpatient Medications Prior to Visit  Medication Sig   aluminum chloride (DRYSOL) 20 % external solution Apply topically at bedtime.   amLODipine (NORVASC) 10 MG tablet Take 10 mg by mouth daily.   desloratadine (CLARINEX) 5 MG tablet TAKE 1 TABLET BY MOUTH AS NEEDED.   EPINEPHrine (EPIPEN 2-PAK) 0.3 mg/0.3 mL SOAJ injection Use as directed   escitalopram (LEXAPRO) 20 MG tablet TAKE 1 TABLET DAILY. MUST ESTABLISH WITH NEW PRIMARY CARE PHYSICIAN BEFORE FURTHER REFILLS   gabapentin (NEURONTIN) 100 MG capsule Take 1 capsule (100 mg total) by mouth at bedtime.   levothyroxine (SYNTHROID) 112 MCG tablet TAKE 1 TABLET BY MOUTH EVERY DAY   rosuvastatin (CRESTOR) 5  MG tablet Take 4 tablets (20 mg total) by mouth daily.   Facility-Administered Medications Prior to Visit  Medication Dose Route Frequency Provider   betamethasone acetate-betamethasone sodium phosphate (CELESTONE) injection 12 mg  12 mg Intramuscular Once Felecia Shelling, DPM    Review of Systems  Respiratory:  Positive for cough.        Right sided chest pain    Musculoskeletal:  Positive for myalgias.    {Insert previous labs (optional):23779} {See past labs  Heme  Chem  Endocrine  Serology  Results Review (optional):1}   Objective    BP 128/70   Pulse 72   Resp 16   Ht 5\' 6"  (1.676 m)   Wt 138 lb (62.6 kg)   BMI 22.27 kg/m  {Insert last BP/Wt (optional):23777}{See vitals history (optional):1}   Physical Exam Vitals reviewed.  Constitutional:      General: She is awake.     Appearance: Normal appearance. She is well-developed and well-groomed.  HENT:     Head: Normocephalic and atraumatic.  Eyes:     General: Lids are normal. Gaze aligned appropriately.     Extraocular Movements: Extraocular movements intact.     Conjunctiva/sclera: Conjunctivae normal.  Cardiovascular:     Pulses: Normal pulses.     Heart sounds:  Normal heart sounds. No murmur heard.    No friction rub. No gallop.  Pulmonary:     Effort: Pulmonary effort is normal.     Breath sounds: Wheezing present. No rhonchi or rales.  Musculoskeletal:     Cervical back: Normal range of motion.  Neurological:     General: No focal deficit present.     Mental Status: She is alert and oriented to person, place, and time.     GCS: GCS eye subscore is 4. GCS verbal subscore is 5. GCS motor subscore is 6.     Cranial Nerves: No cranial nerve deficit, dysarthria or facial asymmetry.  Psychiatric:        Attention and Perception: Attention and perception normal.        Mood and Affect: Mood and affect normal.        Speech: Speech normal.        Behavior: Behavior normal. Behavior is cooperative.         Thought Content: Thought content normal.        Judgment: Judgment normal.       No results found for any visits on 01/14/23.  Assessment & Plan      No follow-ups on file.       Problem List Items Addressed This Visit   None Visit Diagnoses     Costochondritis, acute    -  Primary   Relevant Medications   diclofenac (VOLTAREN) 75 MG EC tablet   Viral upper respiratory tract infection       Relevant Medications   benzonatate (TESSALON) 100 MG capsule   Wheezing       Relevant Medications   predniSONE (DELTASONE) 20 MG tablet        No follow-ups on file.   I, Twana Wileman E Hobart Marte, PA-C, have reviewed all documentation for this visit. The documentation on 01/14/23 for the exam, diagnosis, procedures, and orders are all accurate and complete.   Jacquelin Hawking, MHS, PA-C [facility]

## 2023-01-14 NOTE — Telephone Encounter (Signed)
  Chief Complaint: chest discomfort after coughing and breathing in Symptoms: recent treatment of cold sx took prednisone and z pack. Now chest pain with coughing  and pain in right side under breast to back and now right side of chest. Feels like pulled muscle warm compresses help but pain breathing in last night  Frequency: last night  Pertinent Negatives: Patient denies chest pain with difficulty breathing no sweating no dizziness  Disposition: [] ED /[] Urgent Care (no appt availability in office) / [x] Appointment(In office/virtual)/ []  [facility]/ [] Home Care/ [] Refused Recommended Disposition /[] [facility]/ []  Follow-up with PCP Additional Notes:   No available appt with PCP. Scheduled today [facility] with E. Mecum, PA.       Reason for Disposition  [1] Chest pain lasts > 5 minutes AND [2] occurred > 3 days ago (72 hours) AND [3] NO chest pain or cardiac symptoms now  Answer Assessment - Initial Assessment Questions 1. LOCATION: "Where does it hurt?"       Right side chest and back  2. RADIATION: "Does the pain go anywhere else?" (e.g., into neck, jaw, arms, back)    Upper back  3. ONSET: "When did the chest pain begin?" (Minutes, hours or days)      Tuesday cold sx last night chest pain / discomfort with coughing  4. PATTERN: "Does the pain come and go, or has it been constant since it started?"  "Does it get worse with exertion?"      Comes and goes with taking deep breath ,  5. DURATION: "How long does it last" (e.g., seconds, minutes, hours)     A few seconds  6. SEVERITY: "How bad is the pain?"  (e.g., Scale 1-10; mild, moderate, or severe)    - MILD (1-3): doesn't interfere with normal activities     - MODERATE (4-7): interferes with normal activities or awakens from sleep    - SEVERE (8-10): excruciating pain, unable to do any normal activities       Severe pain with coughing  7. CARDIAC RISK FACTORS: "Do you have any history of heart problems or  risk factors for heart disease?" (e.g., angina, prior heart attack; diabetes, high blood pressure, high cholesterol, smoker, or strong family history of heart disease)     See hx  8. PULMONARY RISK FACTORS: "Do you have any history of lung disease?"  (e.g., blood clots in lung, asthma, emphysema, birth control pills)     na 9. CAUSE: "What do you think is causing the chest pain?"     Pulled muscle 10. OTHER SYMPTOMS: "Do you have any other symptoms?" (e.g., dizziness, nausea, vomiting, sweating, fever, difficulty breathing, cough)       Chest pain / discomfort esp breathing in . Pain radiates from under right breast to back and upper breast 11. PREGNANCY: "Is there any chance you are pregnant?" "When was your last menstrual period?"       na  Protocols used: Chest Pain-A-AH

## 2023-01-14 NOTE — Patient Instructions (Addendum)
You can use warm compresses on the area along with gentle stretches to assist with the pain- try not to let the area get too stiff Please continue to take the steroid to help with inflammation   I have sent in a script for Diclofenac for you to take to assist with pain- do not take other NSAIDs with this. You can take tylenol in between the Diclofenac dosing  Continue to take the Robitussin and mucinex. I have sent in Tessalon pearls to assist with the coughing- these should reduce the coughing reflex further in addition to the steroid  I have sent in a script for Prednisone taper to be taken in the morning with breakfast per the instructions on the container Remember that steroids can cause sleeplessness, irritability, increased hunger and elevated glucose levels so be mindful of these side effects. They should lessen as you progress to the lower doses of the taper.

## 2023-03-02 DIAGNOSIS — R0981 Nasal congestion: Secondary | ICD-10-CM | POA: Diagnosis not present

## 2023-03-02 DIAGNOSIS — J101 Influenza due to other identified influenza virus with other respiratory manifestations: Secondary | ICD-10-CM | POA: Diagnosis not present

## 2023-03-02 DIAGNOSIS — R059 Cough, unspecified: Secondary | ICD-10-CM | POA: Diagnosis not present

## 2023-03-03 ENCOUNTER — Ambulatory Visit: Payer: BC Managed Care – PPO | Admitting: Physician Assistant

## 2023-03-10 ENCOUNTER — Encounter: Payer: Self-pay | Admitting: Family Medicine

## 2023-03-11 DIAGNOSIS — Z566 Other physical and mental strain related to work: Secondary | ICD-10-CM | POA: Diagnosis not present

## 2023-03-11 DIAGNOSIS — F419 Anxiety disorder, unspecified: Secondary | ICD-10-CM | POA: Diagnosis not present

## 2023-03-15 ENCOUNTER — Ambulatory Visit: Payer: BC Managed Care – PPO | Admitting: Family Medicine

## 2023-03-16 ENCOUNTER — Ambulatory Visit
Admission: RE | Admit: 2023-03-16 | Discharge: 2023-03-16 | Disposition: A | Discharge status: Home / Self Care | Payer: BC Managed Care – PPO | Source: Ambulatory Visit | Attending: Family Medicine | Admitting: Family Medicine

## 2023-03-16 DIAGNOSIS — Z1231 Encounter for screening mammogram for malignant neoplasm of breast: Secondary | ICD-10-CM | POA: Insufficient documentation

## 2023-03-17 ENCOUNTER — Other Ambulatory Visit: Payer: Self-pay | Admitting: Family Medicine

## 2023-03-17 NOTE — Telephone Encounter (Signed)
 Medication Refill -  Most Recent Primary Care Visit:  Provider: MECUM, ERIN E  Department: [facility]  Visit Type: OFFICE VISIT  Date: 01/14/2023  Medication: amLODipine  (NORVASC ) 10 MG tablet   Has the patient contacted their pharmacy? Yes (Agent: If no, request that the patient contact the pharmacy for the refill. If patient does not wish to contact the pharmacy document the reason why and proceed with request.) (Agent: If yes, when and what did the pharmacy advise?)  Is this the correct pharmacy for this prescription? Yes If no, delete pharmacy and type the correct one.  This is the patient's preferred pharmacy:   [facility] [address] [place] - [address] [place] [address] Phone: [phone] Fax: [phone]   Has the prescription been filled recently? Yes  Is the patient out of the medication? Yes  Has the patient been seen for an appointment in the last year OR does the patient have an upcoming appointment? Yes  Can we respond through MyChart? Yes  Agent: Please be advised that Rx refills may take up to 3 business days. We ask that you follow-up with your pharmacy.

## 2023-03-18 MED ORDER — AMLODIPINE BESYLATE 10 MG PO TABS: 10.0000 mg | ORAL_TABLET | Freq: Every day | ORAL | 1 refills | Status: DC

## 2023-03-18 NOTE — Telephone Encounter (Signed)
 Requested medications are due for refill today.  Unsure  Requested medications are on the active medications list.  yes  Last refill. 03/18/2022  Future visit scheduled.   yes  Notes to clinic.  Medication is historical.    Requested Prescriptions  Pending Prescriptions Disp Refills   amLODipine  (NORVASC ) 10 MG tablet      Sig: Take 1 tablet (10 mg total) by mouth daily.     Cardiovascular: Calcium  Channel Blockers 2 Passed - 03/18/2023 10:29 AM      Passed - Last BP in normal range    BP Readings from Last 1 Encounters:  01/14/23 128/70         Passed - Last Heart Rate in normal range    Pulse Readings from Last 1 Encounters:  01/14/23 72         Passed - Valid encounter within last 6 months    Recent Outpatient Visits           2 months ago Costochondritis, acute   [facility] Mecum, Rocky BRAVO, PA-C   2 months ago Primary hypertension   [facility] Erda, Cyrus, MD   5 months ago Moderate mixed hyperlipidemia not requiring statin therapy   [facility] Simmons-Robinson, [place], MD   8 months ago Primary hypertension   [facility] Simmons-Robinson, [place], MD   9 months ago Primary hypertension   [facility] Simmons-Robinson, Rockie, MD       Future Appointments             In 2 weeks Simmons-Robinson, Rockie, MD [facility]

## 2023-04-01 ENCOUNTER — Ambulatory Visit (INDEPENDENT_AMBULATORY_CARE_PROVIDER_SITE_OTHER): Payer: BC Managed Care – PPO | Admitting: Family Medicine

## 2023-04-01 ENCOUNTER — Encounter: Payer: Self-pay | Admitting: Family Medicine

## 2023-04-01 VITALS — BP 150/85 | HR 93 | Ht 65.0 in | Wt 146.0 lb

## 2023-04-01 DIAGNOSIS — M546 Pain in thoracic spine: Secondary | ICD-10-CM

## 2023-04-01 DIAGNOSIS — Z0000 Encounter for general adult medical examination without abnormal findings: Secondary | ICD-10-CM

## 2023-04-01 DIAGNOSIS — J349 Unspecified disorder of nose and nasal sinuses: Secondary | ICD-10-CM | POA: Diagnosis not present

## 2023-04-01 DIAGNOSIS — E039 Hypothyroidism, unspecified: Secondary | ICD-10-CM | POA: Diagnosis not present

## 2023-04-01 DIAGNOSIS — R61 Generalized hyperhidrosis: Secondary | ICD-10-CM | POA: Diagnosis not present

## 2023-04-01 DIAGNOSIS — E782 Mixed hyperlipidemia: Secondary | ICD-10-CM

## 2023-04-01 DIAGNOSIS — Z131 Encounter for screening for diabetes mellitus: Secondary | ICD-10-CM

## 2023-04-01 DIAGNOSIS — G8929 Other chronic pain: Secondary | ICD-10-CM | POA: Insufficient documentation

## 2023-04-01 DIAGNOSIS — I10 Essential (primary) hypertension: Secondary | ICD-10-CM

## 2023-04-01 DIAGNOSIS — E559 Vitamin D deficiency, unspecified: Secondary | ICD-10-CM | POA: Diagnosis not present

## 2023-04-01 DIAGNOSIS — R7401 Elevation of levels of liver transaminase levels: Secondary | ICD-10-CM

## 2023-04-01 DIAGNOSIS — F410 Panic disorder [episodic paroxysmal anxiety] without agoraphobia: Secondary | ICD-10-CM

## 2023-04-01 DIAGNOSIS — N951 Menopausal and female climacteric states: Secondary | ICD-10-CM | POA: Diagnosis not present

## 2023-04-01 DIAGNOSIS — F419 Anxiety disorder, unspecified: Secondary | ICD-10-CM | POA: Diagnosis not present

## 2023-04-01 DIAGNOSIS — Z91018 Allergy to other foods: Secondary | ICD-10-CM

## 2023-04-01 DIAGNOSIS — Z130 Encounter for screening for diseases of the blood and blood-forming organs and certain disorders involving the immune mechanism: Secondary | ICD-10-CM

## 2023-04-01 MED ORDER — ALPRAZOLAM 0.5 MG PO TABS: 0.5000 mg | ORAL_TABLET | Freq: Two times a day (BID) | ORAL | 0 refills | Status: AC | PRN

## 2023-04-01 MED ORDER — DOXYCYCLINE HYCLATE 100 MG PO TABS: 100.0000 mg | ORAL_TABLET | Freq: Two times a day (BID) | ORAL | 0 refills | Status: AC

## 2023-04-01 NOTE — Assessment & Plan Note (Signed)
Routine annual physical examination for a 53 year old female. Discussed diet and exercise habits. Reports adapting diet to several small meals a day, avoiding fried foods, bread, and potatoes. Exercise has been limited due to cold weather and recent illnesses. Needs colon cancer screening; Cologuard ordered and will expire in May. - Order CBC, CMP, lipid panel, vitamin D levels, TSH, T3, and T4 levels - Recommend 150 minutes of exercise weekly - Recommend a well-balanced diet - Order Cologuard for colon cancer screening - Recommend completing Cologuard test before expiration in May
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
## 2023-04-01 NOTE — Patient Instructions (Addendum)
It was a pleasure to see you today!  Thank you for choosing [facility] for your primary care.   Today you were seen for your annual physical  Please review the attached information regarding helpful preventive health topics.   To keep you healthy, please keep in mind the following health maintenance items that you are due for:   1.Cologuard colon cancer screening    Best Wishes,   Dr. Roxan Hockey    VISIT SUMMARY:  Today, you had your annual physical exam. We discussed your recent sinus infection, panic attacks, chronic back pain, and alpha-gal syndrome. We also reviewed your general health, including diet, exercise, and smoking cessation.  YOUR PLAN:  -ANNUAL PHYSICAL EXAMINATION: An annual physical exam is a routine check-up to assess your overall health. We discussed your diet and exercise habits, and I recommended 150 minutes of exercise weekly and a well-balanced diet. I ordered routine blood tests and a Cologuard test for colon cancer screening, which should be completed before it expires in May.  -SINUSITIS: Sinusitis is an inflammation of the sinuses that can cause headache, nasal congestion, and postnasal drip. Since your symptoms are not improving, I prescribed doxycycline 100 mg to be taken twice daily for 7 days.  -PANIC ATTACKS: Panic attacks are sudden episodes of intense fear that can cause physical symptoms like chest tightness. You are currently seeing a therapist and using cognitive behavioral techniques. I prescribed Xanax 0.5 mg to be taken as needed for panic attacks and we will follow up in one month to reassess.  -SMOKING CESSATION: Smoking cessation is the process of quitting smoking. We discussed options like bupropion and varenicline, and I advised considering these after you recover from your current illness.  -CHRONIC BACK PAIN: Chronic back pain is long-term pain in the back, often due to injury. Your pain is managed with massage therapy, and  I will provide a letter of medical necessity for your flex spending account.  -ALPHA-GAL SYNDROME: Alpha-gal syndrome is an allergy to mammalian meat caused by a tick bite. I ordered testing to monitor this condition as it can potentially resolve over time.  INSTRUCTIONS:  Please complete the Cologuard test before it expires in May. Take doxycycline 100 mg twice daily for 7 days for your sinusitis. Use Xanax 0.5 mg as needed for panic attacks and follow up in one month to reassess. Consider smoking cessation options after you recover from your current illness. Follow up with your therapist on April 08, 2023.

## 2023-04-04 ENCOUNTER — Encounter: Payer: Self-pay | Admitting: Family Medicine

## 2023-04-05 LAB — CBC
Hematocrit: 41.1 % (ref 34.0–46.6)
Hemoglobin: 13.8 g/dL (ref 11.1–15.9)
MCH: 33.1 pg — ABNORMAL HIGH (ref 26.6–33.0)
MCHC: 33.6 g/dL (ref 31.5–35.7)
MCV: 99 fL — ABNORMAL HIGH (ref 79–97)
Platelets: 214 10*3/uL (ref 150–450)
RBC: 4.17 x10E6/uL (ref 3.77–5.28)
RDW: 11.6 % — ABNORMAL LOW (ref 11.7–15.4)
WBC: 8.8 10*3/uL (ref 3.4–10.8)

## 2023-04-05 LAB — LIPID PANEL
Chol/HDL Ratio: 3.1 {ratio} (ref 0.0–4.4)
Cholesterol, Total: 147 mg/dL (ref 100–199)
HDL: 48 mg/dL (ref >39)
LDL Chol Calc (NIH): 73 mg/dL (ref 0–99)
Triglycerides: 152 mg/dL — ABNORMAL HIGH (ref 0–149)
VLDL Cholesterol Cal: 26 mg/dL (ref 5–40)

## 2023-04-05 LAB — CMP14+EGFR
ALT: 18 [IU]/L (ref 0–32)
AST: 35 [IU]/L (ref 0–40)
Albumin: 4.6 g/dL (ref 3.8–4.9)
Alkaline Phosphatase: 125 [IU]/L — ABNORMAL HIGH (ref 44–121)
BUN/Creatinine Ratio: 12 (ref 9–23)
BUN: 8 mg/dL (ref 6–24)
Bilirubin Total: 0.6 mg/dL (ref 0.0–1.2)
CO2: 24 mmol/L (ref 20–29)
Calcium: 9.6 mg/dL (ref 8.7–10.2)
Chloride: 102 mmol/L (ref 96–106)
Creatinine, Ser: 0.65 mg/dL (ref 0.57–1.00)
Globulin, Total: 2.4 g/dL (ref 1.5–4.5)
Glucose: 97 mg/dL (ref 70–99)
Potassium: 4 mmol/L (ref 3.5–5.2)
Sodium: 140 mmol/L (ref 134–144)
Total Protein: 7 g/dL (ref 6.0–8.5)
eGFR: 107 mL/min/{1.73_m2} (ref >59)

## 2023-04-05 LAB — TSH+T4F+T3FREE
Free T4: 1.19 ng/dL (ref 0.82–1.77)
T3, Free: 3 pg/mL (ref 2.0–4.4)
TSH: 0.57 u[IU]/mL (ref 0.450–4.500)

## 2023-04-05 LAB — ALPHA-GAL PANEL
Allergen Lamb IgE: 0.5 kU/L — AB
IgE (Immunoglobulin E), Serum: 80 [IU]/mL (ref 6–495)
Pork IgE: 0.84 kU/L — AB

## 2023-04-05 LAB — HEMOGLOBIN A1C
Est. average glucose Bld gHb Est-mCnc: 97 mg/dL
Hgb A1c MFr Bld: 5 % (ref 4.8–5.6)

## 2023-04-05 LAB — VITAMIN D 25 HYDROXY (VIT D DEFICIENCY, FRACTURES): Vit D, 25-Hydroxy: 31.9 ng/mL (ref 30.0–100.0)

## 2023-04-29 ENCOUNTER — Ambulatory Visit: Payer: BC Managed Care – PPO | Admitting: Family Medicine

## 2023-05-08 DIAGNOSIS — Z1211 Encounter for screening for malignant neoplasm of colon: Secondary | ICD-10-CM | POA: Diagnosis not present

## 2023-05-10 DIAGNOSIS — L814 Other melanin hyperpigmentation: Secondary | ICD-10-CM | POA: Diagnosis not present

## 2023-05-10 DIAGNOSIS — D1801 Hemangioma of skin and subcutaneous tissue: Secondary | ICD-10-CM | POA: Diagnosis not present

## 2023-05-10 DIAGNOSIS — L74510 Primary focal hyperhidrosis, axilla: Secondary | ICD-10-CM | POA: Diagnosis not present

## 2023-05-10 DIAGNOSIS — L74513 Primary focal hyperhidrosis, soles: Secondary | ICD-10-CM | POA: Diagnosis not present

## 2023-05-17 ENCOUNTER — Telehealth: Payer: Self-pay | Admitting: Family Medicine

## 2023-05-17 NOTE — Telephone Encounter (Signed)
 Called and spoke the pt, she stated she reached out to the company the day she collected the sample to inform them she didn't have any label, she provided them with the information and was told no other information was needed. She has been provided the contact number and directions that was left in the message.

## 2023-05-17 NOTE — Telephone Encounter (Signed)
 Copied from CRM [id number]. Topic: General - Other >> May 17, 2023  8:50 AM Patsy Lager T wrote: Reason for CRM: Olegario Messier from [employer] called stated they recvd a stool sample on 4/1 but patient did not put the date and time of collection. Before they can result it they would need that info. Messages have been left for her to call them with no response. Please contact patient and have her call [phone] select patient support where she will be HIPAA verified.

## 2023-05-20 DIAGNOSIS — F419 Anxiety disorder, unspecified: Secondary | ICD-10-CM | POA: Diagnosis not present

## 2023-05-20 DIAGNOSIS — Z566 Other physical and mental strain related to work: Secondary | ICD-10-CM | POA: Diagnosis not present

## 2023-05-21 LAB — COLOGUARD: COLOGUARD: NEGATIVE

## 2023-05-23 ENCOUNTER — Encounter: Payer: Self-pay | Admitting: Family Medicine

## 2023-05-30 ENCOUNTER — Other Ambulatory Visit: Payer: Self-pay | Admitting: Family Medicine

## 2023-05-30 NOTE — Telephone Encounter (Signed)
 Requested Prescriptions  Pending Prescriptions Disp Refills   rosuvastatin  (CRESTOR ) 20 MG tablet [Pharmacy Med Name: ROSUVASTATIN  TABS 20MG ] 90 tablet 1    Sig: TAKE 1 TABLET DAILY     Cardiovascular:  Antilipid - Statins 2 Failed - 05/30/2023  3:55 PM      Failed - Lipid Panel in normal range within the last 12 months    Cholesterol, Total  Date Value Ref Range Status  04/01/2023 147 100 - 199 mg/dL Final   LDL Chol Calc (NIH)  Date Value Ref Range Status  04/01/2023 73 0 - 99 mg/dL Final   Direct LDL  Date Value Ref Range Status  07/15/2015 112.0 mg/dL Final    Comment:    Optimal:  <100 mg/dLNear or Above Optimal:  100-129 mg/dLBorderline High:  130-159 mg/dLHigh:  160-189 mg/dLVery High:  >190 mg/dL   HDL  Date Value Ref Range Status  04/01/2023 48 >39 mg/dL Final   Triglycerides  Date Value Ref Range Status  04/01/2023 152 (H) 0 - 149 mg/dL Final         Passed - Cr in normal range and within 360 days    Creatinine  Date Value Ref Range Status  01/23/2012 0.85 0.60 - 1.30 mg/dL Final   Creatinine, Ser  Date Value Ref Range Status  04/01/2023 0.65 0.57 - 1.00 mg/dL Final         Passed - Patient is not pregnant      Passed - Valid encounter within last 12 months    Recent Outpatient Visits           1 month ago Annual physical exam   [facility] Mimi Alt, MD

## 2023-06-21 ENCOUNTER — Encounter: Payer: Self-pay | Admitting: Family Medicine

## 2023-06-22 ENCOUNTER — Telehealth: Payer: Self-pay | Admitting: Family Medicine

## 2023-06-22 ENCOUNTER — Other Ambulatory Visit: Payer: Self-pay | Admitting: Family Medicine

## 2023-06-22 DIAGNOSIS — E079 Disorder of thyroid, unspecified: Secondary | ICD-10-CM

## 2023-06-22 DIAGNOSIS — N951 Menopausal and female climacteric states: Secondary | ICD-10-CM

## 2023-06-22 DIAGNOSIS — I10 Essential (primary) hypertension: Secondary | ICD-10-CM

## 2023-06-22 NOTE — Telephone Encounter (Signed)
 Called patient to try ans set up appt about swelling in her ankles. Got no answer so left voicemail. If she calls back it is ok to schedule for the first available.

## 2023-06-24 ENCOUNTER — Encounter: Payer: Self-pay | Admitting: Family Medicine

## 2023-06-24 ENCOUNTER — Ambulatory Visit: Admitting: Family Medicine

## 2023-06-24 VITALS — BP 130/85 | HR 92 | Temp 98.5°F | Ht 66.0 in | Wt 160.7 lb

## 2023-06-24 DIAGNOSIS — E079 Disorder of thyroid, unspecified: Secondary | ICD-10-CM

## 2023-06-24 DIAGNOSIS — R609 Edema, unspecified: Secondary | ICD-10-CM

## 2023-06-24 NOTE — Progress Notes (Signed)
 Established patient visit   Patient: Diane Hahn   DOB: Aug 04, 1970   53 y.o. Female  MRN: [id number] Visit Date: 06/24/2023  Today's healthcare provider: Jeralene Mom, MD   Chief Complaint  Patient presents with   Joint Swelling    L ankle swelling worse then R ankle noticed on Easter. Also complains of dull pain on L leg   Subjective    Discussed the use of AI scribe software for clinical note transcription with the patient, who gave verbal consent to proceed.  History of Present Illness   Diane Hahn is a 53 year old female with hypertension and hypothyroidism who presents with ankle swelling.  She has been experiencing a dull ache in her left ankle, which is intermittent and not tender to touch. The ache is more noticeable with certain movements but subsides with leg elevation. The left ankle is more swollen than the right, and she first noticed the swelling in April while at the beach. The swelling seems to be improving this week and typically occurs around the same time of day.  She stopped taking Estroven, a supplement she had been using for a few months, about three to four days ago after reading that swollen ankles could be a side effect. She has been taking glycopyrrolate for hyperhidrosis, which she has experienced since her twenties and which reappeared in her fifties. The medication helps manage excessive sweating in her armpits and the bottom of her feet. She has not noticed any swelling as a side effect of this medication.  She has a history of hypertension and has been taking 10 amlodipine  for several years. She previously tried hydrochlorothiazide  but only took it two or three times as it was not effective for her. She takes levothyroxine  112 mcg daily for thyroid  management, a dose she has been on for about a year after an adjustment from a higher dose. Her weight has increased from 140 to 160 pounds despite no changes in her diet, which she attributes to  potential thyroid  issues.  No recent changes in her diet or any injuries to her ankle. No pain when putting weight on the ankle and no breathing difficulties, chest pain, or heart palpitations. Her father died of a heart attack at 2, and her mother, who is about to be 75, has had two heart attacks in the last five years. High cholesterol runs in her family. She has changed her diet significantly, cutting out fried foods, potatoes, and bread, and has lost weight as a result.     Wt Readings from Last 3 Encounters:  06/24/23 160 lb 11.2 oz (72.9 kg)  04/01/23 146 lb (66.2 kg)  01/14/23 138 lb (62.6 kg)   Lab Results  Component Value Date   TSH 0.570 04/01/2023   Lab Results  Component Value Date   NA 140 04/01/2023   K 4.0 04/01/2023   CREATININE 0.65 04/01/2023   EGFR 107 04/01/2023   GLUCOSE 97 04/01/2023     Medications: Outpatient Medications Prior to Visit  Medication Sig Note   ALPRAZolam  (XANAX ) 0.5 MG tablet Take 1 tablet (0.5 mg total) by mouth 2 (two) times daily as needed for anxiety.    amLODipine  (NORVASC ) 10 MG tablet Take 1 tablet (10 mg total) by mouth daily.    desloratadine  (CLARINEX ) 5 MG tablet TAKE 1 TABLET BY MOUTH AS NEEDED.    EPINEPHrine  (EPIPEN  2-PAK) 0.3 mg/0.3 mL SOAJ injection Use as directed    escitalopram  (  LEXAPRO ) 20 MG tablet TAKE 1 TABLET DAILY. MUST ESTABLISH WITH NEW PRIMARY CARE PHYSICIAN BEFORE FURTHER REFILLS    fluticasone (FLONASE) 50 MCG/ACT nasal spray Place into both nostrils daily.    gabapentin  (NEURONTIN ) 100 MG capsule TAKE 1 CAPSULE BY MOUTH AT BEDTIME.    glycopyrrolate (ROBINUL) 1 MG tablet SMARTSIG:2 pill By Mouth Every Morning    levothyroxine  (SYNTHROID ) 112 MCG tablet TAKE 1 TABLET BY MOUTH EVERY DAY    MILK THISTLE PO Take by mouth.    Omega-3 Fatty Acids (FISH OIL PO) Take by mouth.    Pseudoephedrine-APAP-DM (DAYQUIL PO) Take by mouth.    rosuvastatin  (CRESTOR ) 20 MG tablet TAKE 1 TABLET DAILY    rosuvastatin  (CRESTOR ) 5  MG tablet Take 4 tablets (20 mg total) by mouth daily.    Apoaequorin (PREVAGEN PO) Take by mouth.    hydrochlorothiazide  (MICROZIDE ) 12.5 MG capsule TAKE 1 CAPSULE BY MOUTH EVERY DAY (Patient not taking: Reported on 06/24/2023)    No facility-administered medications prior to visit.   Review of Systems     Objective    BP 130/85 (BP Location: Left Arm, Patient Position: Sitting, Cuff Size: Normal)   Pulse 92   Temp 98.5 F (36.9 C) (Oral)   Ht 5\' 6"  (1.676 m)   Wt 160 lb 11.2 oz (72.9 kg)   SpO2 99%   BMI 25.94 kg/m   Physical Exam   1+ edema of left ankle, trace on right. No calf tenderness. No varicosities. No gross deformities.    Assessment & Plan     Edema Intermittent left ankle swelling possibly due to amlodipine , thyroid  dysfunction, or environmental factors. Also noted to be on high dose amlodipine . No cardiac or pulmonary issues. Stopped Estroven recently and seems to have improved slightly. - Monitor symptoms for several days. - If symptoms improve, no further action. - If symptoms persist, perform blood work including thyroid  function tests. - Provide lab order valid for one month.  Hypertension Controlled with amlodipine . She states she no longer takes hydrochlorothiazide  due to side effects, although dispense history report shows it being consistently dispensed, most recently for 90 days on  06/23/23    Hypothyroidism Managed with levothyroxine  112 mcg daily. Thyroid  function may contribute to edema. - If edema persists, perform blood work including thyroid  function tests.  Hyperhidrosis Long-standing hyperhidrosis managed with glycopyrrolate by dermatology. Effective but causes dryness.         Jeralene Mom, MD  [facility] [phone] (phone) [phone] (fax)  [facility]

## 2023-07-05 DIAGNOSIS — M79662 Pain in left lower leg: Secondary | ICD-10-CM | POA: Diagnosis not present

## 2023-07-05 DIAGNOSIS — S82832A Other fracture of upper and lower end of left fibula, initial encounter for closed fracture: Secondary | ICD-10-CM | POA: Diagnosis not present

## 2023-07-06 DIAGNOSIS — S82832D Other fracture of upper and lower end of left fibula, subsequent encounter for closed fracture with routine healing: Secondary | ICD-10-CM | POA: Diagnosis not present

## 2023-07-07 ENCOUNTER — Encounter: Payer: Self-pay | Admitting: Family Medicine

## 2023-07-07 DIAGNOSIS — S2239XD Fracture of one rib, unspecified side, subsequent encounter for fracture with routine healing: Secondary | ICD-10-CM

## 2023-07-07 DIAGNOSIS — Z8781 Personal history of (healed) traumatic fracture: Secondary | ICD-10-CM

## 2023-07-21 DIAGNOSIS — S82832D Other fracture of upper and lower end of left fibula, subsequent encounter for closed fracture with routine healing: Secondary | ICD-10-CM | POA: Diagnosis not present

## 2023-08-09 ENCOUNTER — Encounter: Payer: Self-pay | Admitting: Family Medicine

## 2023-08-10 ENCOUNTER — Other Ambulatory Visit: Payer: Self-pay | Admitting: Family Medicine

## 2023-08-10 MED ORDER — ONDANSETRON 4 MG PO TBDP: 4.0000 mg | ORAL_TABLET | Freq: Three times a day (TID) | ORAL | 0 refills | Status: AC | PRN

## 2023-08-10 NOTE — Telephone Encounter (Signed)
 Pt requesting zofran  4mg  ODT PRN for nausea as she is caring for her mother with gastritis   Rx sent

## 2023-08-18 DIAGNOSIS — S82832D Other fracture of upper and lower end of left fibula, subsequent encounter for closed fracture with routine healing: Secondary | ICD-10-CM | POA: Diagnosis not present

## 2023-09-16 ENCOUNTER — Other Ambulatory Visit: Payer: Self-pay | Admitting: Family Medicine

## 2023-09-16 DIAGNOSIS — I10 Essential (primary) hypertension: Secondary | ICD-10-CM

## 2023-09-26 ENCOUNTER — Ambulatory Visit
Admission: RE | Admit: 2023-09-26 | Discharge: 2023-09-26 | Disposition: A | Discharge status: Home / Self Care | Source: Ambulatory Visit | Attending: Family Medicine | Admitting: Family Medicine

## 2023-09-26 DIAGNOSIS — S2239XD Fracture of one rib, unspecified side, subsequent encounter for fracture with routine healing: Secondary | ICD-10-CM | POA: Diagnosis not present

## 2023-09-26 DIAGNOSIS — Z8781 Personal history of (healed) traumatic fracture: Secondary | ICD-10-CM | POA: Insufficient documentation

## 2023-09-26 DIAGNOSIS — Z780 Asymptomatic menopausal state: Secondary | ICD-10-CM | POA: Diagnosis not present

## 2023-09-26 DIAGNOSIS — M8589 Other specified disorders of bone density and structure, multiple sites: Secondary | ICD-10-CM | POA: Diagnosis not present

## 2023-09-27 ENCOUNTER — Ambulatory Visit: Payer: Self-pay | Admitting: Family Medicine

## 2023-11-03 ENCOUNTER — Encounter: Payer: Self-pay | Admitting: Family Medicine

## 2023-11-03 DIAGNOSIS — I10 Essential (primary) hypertension: Secondary | ICD-10-CM

## 2023-11-04 MED ORDER — HYDROCHLOROTHIAZIDE 12.5 MG PO CAPS: 12.5000 mg | ORAL_CAPSULE | Freq: Every day | ORAL | 1 refills | Status: AC

## 2023-11-10 DIAGNOSIS — L74510 Primary focal hyperhidrosis, axilla: Secondary | ICD-10-CM | POA: Diagnosis not present

## 2023-11-10 DIAGNOSIS — D1801 Hemangioma of skin and subcutaneous tissue: Secondary | ICD-10-CM | POA: Diagnosis not present

## 2023-11-10 DIAGNOSIS — L74513 Primary focal hyperhidrosis, soles: Secondary | ICD-10-CM | POA: Diagnosis not present

## 2023-11-10 DIAGNOSIS — L400 Psoriasis vulgaris: Secondary | ICD-10-CM | POA: Diagnosis not present

## 2023-12-21 ENCOUNTER — Other Ambulatory Visit: Payer: Self-pay | Admitting: Family Medicine

## 2023-12-21 DIAGNOSIS — I10 Essential (primary) hypertension: Secondary | ICD-10-CM

## 2023-12-21 DIAGNOSIS — N951 Menopausal and female climacteric states: Secondary | ICD-10-CM

## 2023-12-21 DIAGNOSIS — E079 Disorder of thyroid, unspecified: Secondary | ICD-10-CM

## 2024-01-09 ENCOUNTER — Encounter: Payer: Self-pay | Admitting: Family Medicine

## 2024-01-16 ENCOUNTER — Encounter: Payer: Self-pay | Admitting: Family Medicine

## 2024-01-16 ENCOUNTER — Ambulatory Visit: Admitting: Family Medicine

## 2024-01-16 VITALS — BP 120/76 | HR 77 | Ht 66.0 in | Wt 172.6 lb

## 2024-01-16 DIAGNOSIS — R7401 Elevation of levels of liver transaminase levels: Secondary | ICD-10-CM | POA: Diagnosis not present

## 2024-01-16 DIAGNOSIS — Z91018 Allergy to other foods: Secondary | ICD-10-CM

## 2024-01-16 DIAGNOSIS — F1020 Alcohol dependence, uncomplicated: Secondary | ICD-10-CM

## 2024-01-16 DIAGNOSIS — Z91030 Bee allergy status: Secondary | ICD-10-CM | POA: Insufficient documentation

## 2024-01-16 DIAGNOSIS — E039 Hypothyroidism, unspecified: Secondary | ICD-10-CM | POA: Diagnosis not present

## 2024-01-16 DIAGNOSIS — M8589 Other specified disorders of bone density and structure, multiple sites: Secondary | ICD-10-CM | POA: Insufficient documentation

## 2024-01-16 DIAGNOSIS — F419 Anxiety disorder, unspecified: Secondary | ICD-10-CM | POA: Diagnosis not present

## 2024-01-16 MED ORDER — THIAMINE HCL 100 MG PO TABS: 100.0000 mg | ORAL_TABLET | Freq: Every day | ORAL | 1 refills | Status: AC

## 2024-01-16 MED ORDER — FOLIC ACID 1 MG PO TABS: 1.0000 mg | ORAL_TABLET | Freq: Every day | ORAL | 1 refills | Status: AC

## 2024-01-16 MED ORDER — EPINEPHRINE 0.3 MG/0.3ML IJ SOAJ: INTRAMUSCULAR | 3 refills | Status: AC

## 2024-01-16 MED ORDER — NALTREXONE HCL 50 MG PO TABS: 50.0000 mg | ORAL_TABLET | Freq: Every day | ORAL | 1 refills | Status: AC

## 2024-01-16 NOTE — Patient Instructions (Signed)
 To keep you healthy, please keep in mind the following health maintenance items that you are due for:   Health Maintenance Due  Topic Date Due   Pneumococcal Vaccine: 50+ Years (1 of 2 - PCV) Never done   Hepatitis B Vaccines 19-59 Average Risk (1 of 3 - 19+ 3-dose series) Never done     Best Wishes,   Dr. Lang

## 2024-01-16 NOTE — Progress Notes (Signed)
 Established patient visit   Patient: Diane Hahn   DOB: [birth year]   53 y.o. Female  MRN: [id number] Visit Date: 01/16/2024  Today's healthcare provider: Rockie Agent, MD   Chief Complaint  Patient presents with   Alcohol Problem    Patient is present to discuss alcohol use and need for inpatient treatment.   Subjective     HPI     Alcohol Problem    Additional comments: Patient is present to discuss alcohol use and need for inpatient treatment.      Last edited by Cherry Chiquita HERO, CMA on 01/16/2024 10:19 AM.       Discussed the use of AI scribe software for clinical note transcription with the patient, who gave verbal consent to proceed.  History of Present Illness Diane Hahn is a 54 year old female with alcohol use disorder who presents with a request for inpatient treatment.  She is concerned about her alcohol consumption, particularly binge drinking at night, consuming two to three liquor drinks in the evening. This pattern is attributed to her PTSD and the recent death of her father in February 08, 2024. She has a family history of alcoholism and wants to address her drinking before it worsens. No morning drinking or drinking throughout the day, only binge drinking at night. No eye-opener drinking.  Her alcohol consumption exacerbates her depression, which she describes as situational and worsened by seasonal changes. She reports a PHQ-9 score of 11 and is taking Lexapro  for depression, though she questions its current effectiveness.  She has a history of osteopenia, diagnosed in August, with a T-score of -1.0. She is taking vitamin D3K2 and calcium  supplements to manage this condition. She also fractured her fibula on Memorial Day, which has since healed. She has allergies to bees, fire ants, and pork, and uses an EpiPen  for emergencies. She has a history of a fractured wrist and face, and a recent fibula fracture, which have healed.  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 She has recently reduced her alcohol intake, pouring out vodka to avoid temptation, and has no alcohol currently in her home. She emphasizes the importance of self-care to be able to care for her 69 year old mother, as she is an only child.     Past Medical History:  Diagnosis Date   Allergy    Allergy to alpha-gal    Closed fracture of distal end of radius 07/21/2020   Closed fracture of right wrist 10/09/2020   Enteritis due to Norovirus 06/22/2021   Fracture of foot 02/19/2019   Fracture of orbit (HCC) 10/09/2020   Herpes zoster 02/19/2019   HLD (hyperlipidemia)    Hypertension    Panic attack    Panic attack 07/05/2019   Positive TB test    Thyroid  disease     Medications: Outpatient Medications Prior to Visit  Medication Sig   ALPRAZolam  (XANAX ) 0.5 MG tablet Take 1 tablet (0.5 mg total) by mouth 2 (two) times daily as needed for anxiety.   amLODipine  (NORVASC ) 10 MG tablet TAKE 1 TABLET BY MOUTH EVERY DAY   desloratadine  (CLARINEX ) 5 MG tablet TAKE 1 TABLET BY MOUTH AS NEEDED.   escitalopram  (LEXAPRO ) 20 MG tablet TAKE 1 TABLET DAILY. MUST ESTABLISH WITH NEW PRIMARY CARE PHYSICIAN BEFORE FURTHER REFILLS   fluticasone (FLONASE) 50 MCG/ACT nasal spray Place into both nostrils daily.   gabapentin  (NEURONTIN ) 100 MG capsule TAKE 1 CAPSULE BY MOUTH EVERYDAY  AT BEDTIME   glycopyrrolate (ROBINUL) 1 MG tablet SMARTSIG:2 pill By Mouth Every Morning   hydrochlorothiazide  (MICROZIDE ) 12.5 MG capsule Take 1 capsule (12.5 mg total) by mouth daily.   levothyroxine  (SYNTHROID ) 112 MCG tablet TAKE 1 TABLET BY MOUTH EVERY DAY   MILK THISTLE PO Take by mouth.   Omega-3 Fatty Acids (FISH OIL PO) Take by mouth.   ondansetron  (ZOFRAN -ODT) 4 MG disintegrating tablet Take 1 tablet (4 mg total) by mouth every 8 (eight) hours as needed for nausea or vomiting.   Pseudoephedrine-APAP-DM (DAYQUIL PO) Take by mouth.   rosuvastatin  (CRESTOR ) 20 MG  tablet TAKE 1 TABLET DAILY   UNABLE TO FIND Take 2 tablets by mouth daily. Med Name: Cholesterol off supplement 2 tab daily   Vitamin D -Vitamin K 20-120 MCG/0.1ML LIQD Take 1 capsule by mouth daily.   [DISCONTINUED] EPINEPHrine  (EPIPEN  2-PAK) 0.3 mg/0.3 mL SOAJ injection Use as directed   [DISCONTINUED] Apoaequorin (PREVAGEN PO) Take by mouth.   No facility-administered medications prior to visit.    Review of Systems  Last metabolic panel Lab Results  Component Value Date   GLUCOSE 97 04/01/2023   NA 140 04/01/2023   K 4.0 04/01/2023   CL 102 04/01/2023   CO2 24 04/01/2023   BUN 8 04/01/2023   CREATININE 0.65 04/01/2023   EGFR 107 04/01/2023   CALCIUM  9.6 04/01/2023   PROT 7.0 04/01/2023   ALBUMIN 4.6 04/01/2023   LABGLOB 2.4 04/01/2023   AGRATIO 1.9 06/29/2022   BILITOT 0.6 04/01/2023   ALKPHOS 125 (H) 04/01/2023   AST 35 04/01/2023   ALT 18 04/01/2023   ANIONGAP 8 07/22/2020        Objective    BP 120/76 (BP Location: Left Arm, Patient Position: Sitting, Cuff Size: Normal)   Pulse 77   Ht 5' 6 (1.676 m)   Wt 172 lb 9.6 oz (78.3 kg)   SpO2 99%   BMI 27.86 kg/m   BP Readings from Last 3 Encounters:  01/16/24 120/76  06/24/23 130/85  04/01/23 (!) 150/85   Wt Readings from Last 3 Encounters:  01/16/24 172 lb 9.6 oz (78.3 kg)  06/24/23 160 lb 11.2 oz (72.9 kg)  04/01/23 146 lb (66.2 kg)        Physical Exam Vitals reviewed.  Constitutional:      General: She is not in acute distress.    Appearance: Normal appearance. She is not ill-appearing.  Cardiovascular:     Rate and Rhythm: Normal rate and regular rhythm.  Pulmonary:     Effort: Pulmonary effort is normal. No respiratory distress.     Breath sounds: No wheezing, rhonchi or rales.  Neurological:     Mental Status: She is alert and oriented to person, place, and time.  Psychiatric:        Mood and Affect: Mood normal.        Behavior: Behavior normal.       No results found for any  visits on 01/16/24.  Assessment & Plan     Problem List Items Addressed This Visit     Acquired hypothyroidism   Allergy to alpha-gal   Allergy to bee sting   Relevant Medications   EPINEPHrine  (EPIPEN  2-PAK) 0.3 mg/0.3 mL IJ SOAJ injection   Anxiety   Elevated AST (SGOT)   Osteopenia of multiple sites   Other Visit Diagnoses       Alcohol use disorder, moderate, dependence (HCC)    -  Primary   Relevant Medications  naltrexone  (DEPADE) 50 MG tablet   folic acid  (FOLVITE ) 1 MG tablet   thiamine  (VITAMIN B1) 100 MG tablet   Other Relevant Orders   Ambulatory referral to Psychiatry       Assessment and Plan Assessment & Plan Alcohol use disorder Chronic condition  Characterized by binge drinking, particularly at night, with a family history of alcoholism. Concerns about exacerbation due to PTSD and recent bereavement. Currently not consuming alcohol at home and has reduced intake. Interested in inpatient treatment and naltrexone  to decrease cravings. PHQ-9 score of 11 indicates moderate depression, potentially contributing to alcohol use. - Started naltrexone  50 mg once daily. - Referred to psychiatry for urgent detoxification and further management. - Discussed intensive outpatient treatment program at [facility]. - Prescribed thiamine  and folic acid  to address potential vitamin deficiencies due to alcohol use.  Osteopenia Chronic condition  Confirmed by body scan with a T-score of -1.0. Risk of major osteoporotic fracture is 11% and hip fracture is 2%. Currently taking vitamin D3K and calcium  supplements. - Continue vitamin D3K and calcium  supplementation. - Will consider osteoporosis medication in the future if needed.  Allergy to bee sting and alpha-gal Allergy to bee stings and alpha-gal (pork). EpiPen  is due for renewal in the spring. - Renewed EpiPen  prescription      Return in about 6 weeks (around 02/27/2024) for Alcohol use f/u .         Rockie Agent, MD  [facility] [phone] (phone) [phone] (fax)  [facility]

## 2024-01-20 DIAGNOSIS — F419 Anxiety disorder, unspecified: Secondary | ICD-10-CM | POA: Diagnosis not present

## 2024-01-20 DIAGNOSIS — Z6379 Other stressful life events affecting family and household: Secondary | ICD-10-CM | POA: Diagnosis not present

## 2024-01-20 DIAGNOSIS — F330 Major depressive disorder, recurrent, mild: Secondary | ICD-10-CM | POA: Diagnosis not present

## 2024-01-23 ENCOUNTER — Encounter: Payer: Self-pay | Admitting: Family Medicine

## 2024-01-23 ENCOUNTER — Other Ambulatory Visit: Payer: Self-pay

## 2024-01-23 MED ORDER — ESCITALOPRAM OXALATE 20 MG PO TABS: 20.0000 mg | ORAL_TABLET | Freq: Every day | ORAL | 0 refills | Status: DC

## 2024-02-15 ENCOUNTER — Other Ambulatory Visit: Payer: Self-pay | Admitting: Family Medicine

## 2024-02-16 ENCOUNTER — Other Ambulatory Visit: Payer: Self-pay | Admitting: Family Medicine

## 2024-02-16 DIAGNOSIS — Z1231 Encounter for screening mammogram for malignant neoplasm of breast: Secondary | ICD-10-CM

## 2024-02-29 ENCOUNTER — Ambulatory Visit: Admitting: Family Medicine

## 2024-03-01 ENCOUNTER — Ambulatory Visit: Admitting: Family Medicine

## 2024-03-01 ENCOUNTER — Encounter: Payer: Self-pay | Admitting: Family Medicine

## 2024-03-01 VITALS — BP 119/71 | HR 66 | Ht 66.0 in | Wt 166.0 lb

## 2024-03-01 DIAGNOSIS — F419 Anxiety disorder, unspecified: Secondary | ICD-10-CM | POA: Diagnosis not present

## 2024-03-01 DIAGNOSIS — E039 Hypothyroidism, unspecified: Secondary | ICD-10-CM

## 2024-03-01 DIAGNOSIS — M8589 Other specified disorders of bone density and structure, multiple sites: Secondary | ICD-10-CM

## 2024-03-01 DIAGNOSIS — F1020 Alcohol dependence, uncomplicated: Secondary | ICD-10-CM

## 2024-03-01 DIAGNOSIS — E782 Mixed hyperlipidemia: Secondary | ICD-10-CM

## 2024-03-01 DIAGNOSIS — N951 Menopausal and female climacteric states: Secondary | ICD-10-CM | POA: Diagnosis not present

## 2024-03-01 DIAGNOSIS — I10 Essential (primary) hypertension: Secondary | ICD-10-CM | POA: Diagnosis not present

## 2024-03-01 DIAGNOSIS — F17200 Nicotine dependence, unspecified, uncomplicated: Secondary | ICD-10-CM | POA: Diagnosis not present

## 2024-03-01 DIAGNOSIS — E559 Vitamin D deficiency, unspecified: Secondary | ICD-10-CM | POA: Diagnosis not present

## 2024-03-01 NOTE — Progress Notes (Signed)
 "  Established Patient Office Visit  Patient ID: Diane Hahn, female    DOB: October 20, 1970  Age: 54 y.o. MRN: [id number] PCP: Sharma Coyer, MD  Chief Complaint  Patient presents with   Follow-up   Alcohol Use    Patient reports no longer taking medication as she no longer needs it.    Subjective:     HPI  Discussed the use of AI scribe software for clinical note transcription with the patient, who gave verbal consent to proceed.  History of Present Illness Diane Hahn is a 54 year old female who presents for follow-up on her chronic alcohol use.  She has abstained from alcohol since her last visit and discontinued naltrexone  after four days. She attributes her success to a strong support system, including her husband who also quit drinking, and ongoing therapy sessions. She avoids keeping alcohol at home to prevent temptation, as she tends to binge drink once she starts.  She manages her chronic hypothyroidism with Synthroid  112 mcg daily and is curious about her thyroid  levels, awaiting her next physical to check them. She mentions her massage therapist has been working on her thyroid .  For hyperlipidemia, she takes Crestor  20 mg daily.  Her chronic hypertension is managed with a combination of medications, including amlodipine  10 mg and another medication at 104/12.5 mg daily.  She experiences vasomotor symptoms associated with menopause and was previously using gabapentin  100 mg, but has not taken it recently due to misplaced medication and reduced symptoms following hormone balancing massages. She is also on Lexapro  20 mg daily, which she started after her father's death to help with anxiety and panic attacks. She reports that it helps her remain calm, especially in her marriage.  She has a history of osteopenia and vitamin D  deficiency. She takes vitamin D  and K2 supplements and drinks almond milk for calcium  but has not started calcium  supplements due to disliking  the taste of gummies.  She has resumed smoking, currently smoking about a pack every three days, and acknowledges the difficulty in quitting due to the psychological aspect of the habit. She uses a string to manage her hand-mouth fixation and has tried nicotine pouches but is not ready to quit smoking entirely.   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   Healthcare maintenance 06/15/2022   Elevated AST (SGOT) 04/02/2022   Annual physical exam 04/01/2022   Vitamin D  deficiency 04/01/2022   Allergy to alpha-gal 04/01/2022   Dermatitis 03/18/2022   Tinea corporis 03/18/2022   History of tuberculosis 02/19/2019   Hyperhomocysteinemia 05/25/2017   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   Chronic fatigue 08/27/2014   Thyroid  dysfunction 07/17/2014   Hypertension 07/17/2014   Obesity (BMI 30-39.9) 04/15/2014   Menorrhagia with regular cycle 11/16/2013   Allergic rhinitis 12/10/2011   Anxiety 10/29/2011   Hyperlipidemia 02/04/2011   ABNORMAL EKG 07/11/2009   Past Medical History:  Diagnosis Date   Allergy    Allergy to alpha-gal    Closed fracture of distal end of radius 07/21/2020   Closed fracture of right wrist 10/09/2020   Enteritis due to Norovirus 06/22/2021   Fracture of foot 02/19/2019   Fracture of orbit (HCC) 10/09/2020   Herpes zoster 02/19/2019  HLD (hyperlipidemia)    Hypertension    Panic attack    Panic attack 07/05/2019   Positive TB test    Thyroid  disease       01/16/2024   10:33 AM 06/24/2023    4:07 PM 01/14/2023    3:12 PM  PHQ9 SCORE ONLY  PHQ-9 Total Score 11 0  0      Data saved with a previous flowsheet row definition      01/16/2024   10:33 AM 06/24/2023     4:07 PM 09/29/2022   10:18 AM  GAD 7 : Generalized Anxiety Score  Nervous, Anxious, on Edge 1  0  0   Control/stop worrying 0  0  0   Worry too much - different things 0  0  0   Trouble relaxing 0  0  0   Restless 0  0  0   Easily annoyed or irritable 1  0  1   Afraid - awful might happen 0  0  0   Total GAD 7 Score 2 0 1  Anxiety Difficulty Not difficult at all Not difficult at all Somewhat difficult     Data saved with a previous flowsheet row definition      ROS    Objective:     BP 119/71 (BP Location: Left Arm, Patient Position: Sitting, Cuff Size: Normal)   Pulse 66   Ht 5' 6 (1.676 m)   Wt 166 lb (75.3 kg)   SpO2 98%   BMI 26.79 kg/m   BP Readings from Last 3 Encounters:  03/01/24 119/71  01/16/24 120/76  06/24/23 130/85   Wt Readings from Last 3 Encounters:  03/01/24 166 lb (75.3 kg)  01/16/24 172 lb 9.6 oz (78.3 kg)  06/24/23 160 lb 11.2 oz (72.9 kg)      Physical Exam Vitals reviewed.  Constitutional:      General: She is not in acute distress.    Appearance: Normal appearance. She is not ill-appearing.  Cardiovascular:     Rate and Rhythm: Normal rate and regular rhythm.  Pulmonary:     Effort: Pulmonary effort is normal. No respiratory distress.     Breath sounds: No wheezing, rhonchi or rales.  Neurological:     Mental Status: She is alert and oriented to person, place, and time.  Psychiatric:        Mood and Affect: Mood normal.        Behavior: Behavior normal.     Physical Exam    No results found for any visits on 03/01/24.  Last metabolic panel Lab Results  Component Value Date   GLUCOSE 97 04/01/2023   NA 140 04/01/2023   K 4.0 04/01/2023   CL 102 04/01/2023   CO2 24 04/01/2023   BUN 8 04/01/2023   CREATININE 0.65 04/01/2023   EGFR 107 04/01/2023   CALCIUM  9.6 04/01/2023   PROT 7.0 04/01/2023   ALBUMIN 4.6 04/01/2023   LABGLOB 2.4 04/01/2023   AGRATIO 1.9 06/29/2022   BILITOT 0.6 04/01/2023   ALKPHOS 125 (H)  04/01/2023   AST 35 04/01/2023   ALT 18 04/01/2023   ANIONGAP 8 07/22/2020   Last lipids Lab Results  Component Value Date   CHOL 147 04/01/2023   HDL 48 04/01/2023   LDLCALC 73 04/01/2023   LDLDIRECT 112.0 07/15/2015   TRIG 152 (H) 04/01/2023   CHOLHDL 3.1 04/01/2023   Last hemoglobin A1c Lab Results  Component Value Date   HGBA1C 5.0 04/01/2023  Last thyroid  functions Lab Results  Component Value Date   TSH 0.570 04/01/2023   FREET4 1.19 04/01/2023      The 10-year ASCVD risk score (Arnett DK, et al., 2019) is: 3.7%  Outpatient Encounter Medications as of 03/01/2024  Medication Sig   ALPRAZolam  (XANAX ) 0.5 MG tablet Take 1 tablet (0.5 mg total) by mouth 2 (two) times daily as needed for anxiety.   amLODipine  (NORVASC ) 10 MG tablet TAKE 1 TABLET BY MOUTH EVERY DAY   desloratadine  (CLARINEX ) 5 MG tablet TAKE 1 TABLET BY MOUTH AS NEEDED.   EPINEPHrine  (EPIPEN  2-PAK) 0.3 mg/0.3 mL IJ SOAJ injection Use as directed   escitalopram  (LEXAPRO ) 20 MG tablet TAKE 1 TABLET BY MOUTH EVERY DAY   fluticasone (FLONASE) 50 MCG/ACT nasal spray Place into both nostrils daily.   folic acid  (FOLVITE ) 1 MG tablet Take 1 tablet (1 mg total) by mouth daily.   gabapentin  (NEURONTIN ) 100 MG capsule TAKE 1 CAPSULE BY MOUTH EVERYDAY AT BEDTIME   glycopyrrolate (ROBINUL) 1 MG tablet SMARTSIG:2 pill By Mouth Every Morning   hydrochlorothiazide  (MICROZIDE ) 12.5 MG capsule Take 1 capsule (12.5 mg total) by mouth daily.   levothyroxine  (SYNTHROID ) 112 MCG tablet TAKE 1 TABLET BY MOUTH EVERY DAY   MILK THISTLE PO Take by mouth.   Omega-3 Fatty Acids (FISH OIL PO) Take by mouth.   ondansetron  (ZOFRAN -ODT) 4 MG disintegrating tablet Take 1 tablet (4 mg total) by mouth every 8 (eight) hours as needed for nausea or vomiting.   Pseudoephedrine-APAP-DM (DAYQUIL PO) Take by mouth.   rosuvastatin  (CRESTOR ) 20 MG tablet TAKE 1 TABLET DAILY   thiamine  (VITAMIN B1) 100 MG tablet Take 1 tablet (100 mg total) by  mouth daily.   UNABLE TO FIND Take 2 tablets by mouth daily. Med Name: Cholesterol off supplement 2 tab daily   Vitamin D -Vitamin K 20-120 MCG/0.1ML LIQD Take 1 capsule by mouth daily.   naltrexone  (DEPADE) 50 MG tablet Take 1 tablet (50 mg total) by mouth daily. (Patient not taking: Reported on 03/01/2024)   No facility-administered encounter medications on file as of 03/01/2024.       Assessment & Plan:   Problem List Items Addressed This Visit     Acquired hypothyroidism - Primary   Anxiety   Hyperlipidemia   Hypertension   Osteopenia of multiple sites   Tobacco use disorder   Vasomotor symptoms due to menopause   Vitamin D  deficiency   Other Visit Diagnoses       Alcohol use disorder, moderate, dependence (HCC)           Assessment and Plan Assessment & Plan Alcohol use disorder, in remission Chronic  Alcohol use disorder is in remission with no alcohol consumption since the last visit. She has discontinued naltrexone  as she feels it is no longer needed. She has a strong support system, including her husband and therapist, and has engaged in new hobbies to manage stress and avoid alcohol-related triggers. - Continue current management without naltrexone . - Encouraged continued engagement with therapist and support system. - Advised to maintain avoidance of alcohol triggers.  Primary hypertension Chronic  Blood pressure is well-controlled at 119/71 mmHg with current medication regimen. - Continue current antihypertensive regimen including hydrochlorothiazide  12.5mg  daily and amlodipine  10mg  daily  Acquired hypothyroidism Chronic  Managed with Synthroid  112 mcg daily. She is anxious to see thyroid  function test results at the upcoming physical. - Continue Synthroid  112 mcg daily. - Will check thyroid  function tests at the upcoming physical.  Osteopenia Chronic  Managed with vitamin D  and K2 supplementation. She is not currently taking calcium  supplements due to  dissatisfaction with the taste of gummies. She is aware of the need for calcium  and plans to obtain calcium  tablets. Smoking cessation is encouraged as it may negatively impact bone density. - Continue vitamin D  and K2 supplementation. - Obtain calcium  tablets for supplementation. - Will consider Fosamax if bone density worsens or if there is a fracture. - Encouraged smoking cessation to improve bone health.  Vasomotor symptoms of menopause Chronic  Vasomotor symptoms are well-managed with hormone balancing massages. Gabapentin  was previously used but is not currently needed as symptoms are controlled. - Discontinued gabapentin  if symptoms remain controlled.  Vitamin D  deficiency Chronic  Managed with vitamin D  supplementation. - Continue vitamin D  supplementation.  Mixed hyperlipidemia Chronic  Managed with Crestor  20 mg daily. She is aware of the need for cholesterol monitoring. - Continue Crestor  20 mg daily. - Will check cholesterol levels at the upcoming physical.  Anxiety disorder Chronic  Managed with Lexapro  20 mg daily, which also aids in managing marital stress and preventing panic attacks. She reports no recent panic attacks and finds Lexapro  beneficial for maintaining calmness. - Continue Lexapro  20 mg daily.  Tobacco use disorder Chronic  She smokes approximately a pack every three days and expresses a desire to quit but is not ready to stop completely. She acknowledges the psychological aspect of smoking and is aware of the health risks. She has a moderate interest in quitting, scoring a 4 on a scale of 0 to 10. - Encouraged gradual reduction in smoking; counseled on smoking cessation for 5 mins  - Discussed potential cessation aids and strategies at the next visit.    Return in about 1 month (around 04/03/2024) for CPE, Chk Cholesterol/A1c/Thyroid .    Rockie Agent, MD [facility]  "

## 2024-03-01 NOTE — Patient Instructions (Addendum)
 To keep you healthy, please keep in mind the following health maintenance items that you are due for:   Health Maintenance Due  Topic Date Due   Pneumococcal Vaccine: 50+ Years (1 of 2 - PCV) Never done   Hepatitis B Vaccines 19-59 Average Risk (1 of 3 - 19+ 3-dose series) Never done     Best Wishes,   Dr. Lang

## 2024-03-16 ENCOUNTER — Encounter

## 2024-03-16 DIAGNOSIS — Z1231 Encounter for screening mammogram for malignant neoplasm of breast: Secondary | ICD-10-CM

## 2024-04-03 ENCOUNTER — Ambulatory Visit: Admitting: Family Medicine
